# Patient Record
Sex: Male | Born: 1980 | Race: Black or African American | Hispanic: No | Marital: Single | Smoking: Current every day smoker
Health system: Southern US, Community
[De-identification: ages and names within clinical notes are randomized; demographics above are authoritative.]

## PROBLEM LIST (undated history)

## (undated) ENCOUNTER — Emergency Department
Admission: EM | Disposition: A | Discharge status: Home / Self Care | Payer: Self-pay | Attending: Diagnostic Radiology | Admitting: Diagnostic Radiology

## (undated) DIAGNOSIS — E119 Type 2 diabetes mellitus without complications: Secondary | ICD-10-CM

## (undated) DIAGNOSIS — Z8619 Personal history of other infectious and parasitic diseases: Secondary | ICD-10-CM

## (undated) DIAGNOSIS — I10 Essential (primary) hypertension: Secondary | ICD-10-CM

## (undated) HISTORY — DX: Personal history of other infectious and parasitic diseases: Z86.19

---

## 2009-05-30 ENCOUNTER — Emergency Department: Payer: Self-pay | Admitting: Emergency Medicine

## 2010-01-01 ENCOUNTER — Emergency Department: Payer: Self-pay | Admitting: Unknown Physician Specialty

## 2010-01-05 ENCOUNTER — Observation Stay: Payer: Self-pay | Admitting: Unknown Physician Specialty

## 2010-06-04 HISTORY — PX: FRACTURE SURGERY: SHX138

## 2010-09-06 ENCOUNTER — Emergency Department: Payer: Self-pay | Admitting: Emergency Medicine

## 2011-05-22 ENCOUNTER — Emergency Department: Payer: Self-pay | Admitting: Emergency Medicine

## 2011-09-06 ENCOUNTER — Emergency Department: Payer: Self-pay | Admitting: Emergency Medicine

## 2012-07-20 ENCOUNTER — Emergency Department: Payer: Self-pay | Admitting: Emergency Medicine

## 2012-10-29 ENCOUNTER — Emergency Department: Payer: Self-pay | Admitting: Internal Medicine

## 2013-05-31 ENCOUNTER — Emergency Department: Payer: Self-pay | Admitting: Emergency Medicine

## 2013-05-31 LAB — RAPID INFLUENZA A&B ANTIGENS

## 2013-06-30 ENCOUNTER — Emergency Department: Payer: Self-pay | Admitting: Emergency Medicine

## 2013-12-11 ENCOUNTER — Emergency Department: Payer: Self-pay | Admitting: Emergency Medicine

## 2013-12-11 LAB — CBC WITH DIFFERENTIAL/PLATELET
Basophil #: 0.1 10*3/uL (ref 0.0–0.1)
Basophil %: 0.8 %
Eosinophil #: 0.1 10*3/uL (ref 0.0–0.7)
Eosinophil %: 0.8 %
HCT: 45.1 % (ref 40.0–52.0)
HGB: 14.6 g/dL (ref 13.0–18.0)
Lymphocyte #: 2.5 10*3/uL (ref 1.0–3.6)
Lymphocyte %: 24 %
MCH: 25.7 pg — ABNORMAL LOW (ref 26.0–34.0)
MCHC: 32.4 g/dL (ref 32.0–36.0)
MCV: 79 fL — AB (ref 80–100)
Monocyte #: 0.7 x10 3/mm (ref 0.2–1.0)
Monocyte %: 6.5 %
NEUTROS ABS: 7 10*3/uL — AB (ref 1.4–6.5)
Neutrophil %: 67.9 %
Platelet: 184 10*3/uL (ref 150–440)
RBC: 5.68 10*6/uL (ref 4.40–5.90)
RDW: 13.6 % (ref 11.5–14.5)
WBC: 10.4 10*3/uL (ref 3.8–10.6)

## 2013-12-11 LAB — COMPREHENSIVE METABOLIC PANEL
ALT: 20 U/L (ref 12–78)
AST: 17 U/L (ref 15–37)
Albumin: 3.7 g/dL (ref 3.4–5.0)
Alkaline Phosphatase: 72 U/L
Anion Gap: 10 (ref 7–16)
BILIRUBIN TOTAL: 0.6 mg/dL (ref 0.2–1.0)
BUN: 8 mg/dL (ref 7–18)
CHLORIDE: 103 mmol/L (ref 98–107)
CO2: 26 mmol/L (ref 21–32)
Calcium, Total: 8.9 mg/dL (ref 8.5–10.1)
Creatinine: 1.44 mg/dL — ABNORMAL HIGH (ref 0.60–1.30)
EGFR (African American): >60
EGFR (Non-African Amer.): >60
GLUCOSE: 124 mg/dL — AB (ref 65–99)
Osmolality: 277 (ref 275–301)
POTASSIUM: 3.8 mmol/L (ref 3.5–5.1)
Sodium: 139 mmol/L (ref 136–145)
Total Protein: 7.6 g/dL (ref 6.4–8.2)

## 2013-12-11 LAB — TROPONIN I: Troponin-I: <0.02 ng/mL

## 2013-12-11 LAB — LIPASE, BLOOD: Lipase: 97 U/L (ref 73–393)

## 2014-07-05 ENCOUNTER — Emergency Department: Payer: Self-pay | Admitting: Emergency Medicine

## 2014-07-05 LAB — URINALYSIS, COMPLETE
BILIRUBIN, UR: NEGATIVE
BLOOD: NEGATIVE
Bacteria: NONE SEEN
Glucose,UR: NEGATIVE mg/dL (ref 0–75)
KETONE: NEGATIVE
LEUKOCYTE ESTERASE: NEGATIVE
Nitrite: NEGATIVE
Ph: 5 (ref 4.5–8.0)
Protein: 30
RBC,UR: NONE SEEN /HPF (ref 0–5)
Specific Gravity: 1.027 (ref 1.003–1.030)
Squamous Epithelial: <1

## 2014-07-05 LAB — CBC WITH DIFFERENTIAL/PLATELET
BASOS ABS: 0 10*3/uL (ref 0.0–0.1)
Basophil %: 0.4 %
EOS PCT: 0.8 %
Eosinophil #: 0.1 10*3/uL (ref 0.0–0.7)
HCT: 45.8 % (ref 40.0–52.0)
HGB: 14.7 g/dL (ref 13.0–18.0)
LYMPHS PCT: 11.1 %
Lymphocyte #: 0.9 10*3/uL — ABNORMAL LOW (ref 1.0–3.6)
MCH: 24.7 pg — ABNORMAL LOW (ref 26.0–34.0)
MCHC: 32.2 g/dL (ref 32.0–36.0)
MCV: 77 fL — ABNORMAL LOW (ref 80–100)
MONOS PCT: 7.4 %
Monocyte #: 0.6 x10 3/mm (ref 0.2–1.0)
Neutrophil #: 6.3 10*3/uL (ref 1.4–6.5)
Neutrophil %: 80.3 %
PLATELETS: 179 10*3/uL (ref 150–440)
RBC: 5.95 10*6/uL — ABNORMAL HIGH (ref 4.40–5.90)
RDW: 13.9 % (ref 11.5–14.5)
WBC: 7.9 10*3/uL (ref 3.8–10.6)

## 2014-07-05 LAB — COMPREHENSIVE METABOLIC PANEL
ALK PHOS: 82 U/L (ref 46–116)
ALT: 26 U/L (ref 14–63)
AST: 24 U/L (ref 15–37)
Albumin: 3.3 g/dL — ABNORMAL LOW (ref 3.4–5.0)
Anion Gap: 7 (ref 7–16)
BILIRUBIN TOTAL: 0.9 mg/dL (ref 0.2–1.0)
BUN: 8 mg/dL (ref 7–18)
CO2: 24 mmol/L (ref 21–32)
Calcium, Total: 8.5 mg/dL (ref 8.5–10.1)
Chloride: 106 mmol/L (ref 98–107)
Creatinine: 1.37 mg/dL — ABNORMAL HIGH (ref 0.60–1.30)
EGFR (African American): >60
Glucose: 118 mg/dL — ABNORMAL HIGH (ref 65–99)
Osmolality: 273 (ref 275–301)
Potassium: 3.7 mmol/L (ref 3.5–5.1)
Sodium: 137 mmol/L (ref 136–145)
Total Protein: 7.6 g/dL (ref 6.4–8.2)

## 2014-07-05 LAB — LIPASE, BLOOD: Lipase: 70 U/L — ABNORMAL LOW (ref 73–393)

## 2015-08-09 ENCOUNTER — Emergency Department: Payer: Self-pay

## 2015-08-09 ENCOUNTER — Emergency Department
Admission: EM | Admit: 2015-08-09 | Discharge: 2015-08-09 | Disposition: A | Discharge status: Home / Self Care | Payer: Self-pay | Attending: Emergency Medicine | Admitting: Emergency Medicine

## 2015-08-09 DIAGNOSIS — I10 Essential (primary) hypertension: Secondary | ICD-10-CM | POA: Insufficient documentation

## 2015-08-09 DIAGNOSIS — M546 Pain in thoracic spine: Secondary | ICD-10-CM | POA: Insufficient documentation

## 2015-08-09 DIAGNOSIS — J069 Acute upper respiratory infection, unspecified: Secondary | ICD-10-CM | POA: Insufficient documentation

## 2015-08-09 DIAGNOSIS — F17210 Nicotine dependence, cigarettes, uncomplicated: Secondary | ICD-10-CM | POA: Insufficient documentation

## 2015-08-09 DIAGNOSIS — Z20828 Contact with and (suspected) exposure to other viral communicable diseases: Secondary | ICD-10-CM | POA: Insufficient documentation

## 2015-08-09 HISTORY — DX: Essential (primary) hypertension: I10

## 2015-08-09 LAB — RAPID INFLUENZA A&B ANTIGENS: Influenza A (ARMC): NEGATIVE

## 2015-08-09 LAB — RAPID INFLUENZA A&B ANTIGENS (ARMC ONLY): INFLUENZA B (ARMC): NEGATIVE

## 2015-08-09 MED ORDER — PSEUDOEPH-BROMPHEN-DM 30-2-10 MG/5ML PO SYRP: 10.0000 mL | ORAL_SOLUTION | Freq: Four times a day (QID) | ORAL | Status: DC | PRN

## 2015-08-09 MED ORDER — OSELTAMIVIR PHOSPHATE 75 MG PO CAPS: 75.0000 mg | ORAL_CAPSULE | Freq: Every day | ORAL | Status: DC

## 2015-08-09 NOTE — ED Notes (Signed)
Pt c/o cough congestion that started today and states his children were recently dx with the flu..Marland Kitchen

## 2015-08-09 NOTE — Discharge Instructions (Signed)

## 2015-08-09 NOTE — ED Provider Notes (Signed)
[facility] Emergency Department Provider Note  ____________________________________________  Time seen: Approximately 10:10 AM  I have reviewed the triage vital signs and the nursing notes.   HISTORY  Chief Complaint URI    HPI Dalton Foster is a 35 y.o. male, NAD, reports the emergency department with 10 day history of cough and congestion. Notes he had body aches began yesterday. Has a child that was diagnosed with flu yesterday. Has not had any fever, chills. Denies any nasal congestion, ear pain, sore throat. Concerned he may have pneumonia. Has been taking Alka-Seltzer plus over-the-counter with no alleviation of symptoms.   Past Medical History  Diagnosis Date  . Hypertension     There are no active problems to display for this patient.   Past Surgical History  Procedure Laterality Date  . Fracture surgery      Current Outpatient Rx  Name  Route  Sig  Dispense  Refill  . brompheniramine-pseudoephedrine-DM 30-2-10 MG/5ML syrup   Oral   Take 10 mLs by mouth 4 (four) times daily as needed.   200 mL   0   . oseltamivir (TAMIFLU) 75 MG capsule   Oral   Take 1 capsule (75 mg total) by mouth daily.   10 capsule   0     Allergies Review of patient's allergies indicates no known allergies.  No family history on file.  Social History Social History  Substance Use Topics  . Smoking status: Current Every Day Smoker    Types: Cigarettes  . Smokeless tobacco: None  . Alcohol Use: Yes     Review of Systems  Constitutional: No fever/chills, fatigue Eyes: No visual changes. No discharge, erythema, swelling. ENT: No sore throat, nasal congestion, ear pain, runny nose. Cardiovascular: No chest pain. Respiratory: Positive chest congestion, cough. No shortness of breath. No wheezing.  Gastrointestinal: No abdominal pain.  No nausea, vomiting.  No diarrhea.  Musculoskeletal: Positive for upper torso aches.  Skin: Negative for  rash. Neurological: Negative for headaches, focal weakness or numbness. 10-point ROS otherwise negative.  ____________________________________________   PHYSICAL EXAM:  VITAL SIGNS: ED Triage Vitals  Enc Vitals Group     BP 08/09/15 0929 146/95 mmHg     Pulse Rate 08/09/15 0929 83     Resp 08/09/15 0929 18     Temp 08/09/15 0929 98.1 F (36.7 C)     Temp Source 08/09/15 0929 Oral     SpO2 08/09/15 0929 100 %     Weight 08/09/15 0929 265 lb (120.203 kg)     Height 08/09/15 0929 5\' 7"  (1.702 m)     Head Cir --      Peak Flow --      Pain Score 08/09/15 0930 4     Pain Loc --      Pain Edu? --      Excl. in GC? --     Constitutional: Alert and oriented. Well appearing and in no acute distress. Eyes: Conjunctivae are normal. PERRL. EOMI without pain.  Head: Atraumatic. ENT:      Ears: TMs visualized bilaterally without erythema, swelling, perforation. Mild serous effusion bilaterally.      Nose: No congestion/rhinnorhea.      Mouth/Throat: Mucous membranes are moist. Pharynx without erythema, swelling, exudate. Neck: No stridor. Supple with full range of motion Hematological/Lymphatic/Immunilogical: No cervical lymphadenopathy. Cardiovascular: Normal rate, regular rhythm. Normal S1 and S2.  Good peripheral circulation. Respiratory: Normal respiratory effort without tachypnea or retractions. Lungs CTAB. Neurologic:  Normal speech and language. No gross focal neurologic deficits are appreciated.  Skin:  Skin is warm, dry and intact. No rash noted. Psychiatric: Mood and affect are normal. Speech and behavior are normal. Patient exhibits appropriate insight and judgement.   ____________________________________________   LABS (all labs ordered are listed, but only abnormal results are displayed)  Labs Reviewed  RAPID INFLUENZA A&B ANTIGENS ([facility] ONLY)   ____________________________________________  EKG  None ____________________________________________  RADIOLOGY I  have personally viewed and evaluated these images (plain radiographs) as part of my medical decision making, as well as reviewing the written report by the radiologist.  Dg Chest 2 View  08/09/2015  CLINICAL DATA:  Cough and congestion EXAM: CHEST  2 VIEW COMPARISON:  None. FINDINGS: Normal heart size. Low lung volumes. No pneumothorax. No pleural effusion. IMPRESSION: No active cardiopulmonary disease. Electronically Signed   By: Jolaine Click M.D.   On: 08/09/2015 11:08    ____________________________________________    PROCEDURES  Procedure(s) performed: None    Medications - No data to display   ____________________________________________   INITIAL IMPRESSION / ASSESSMENT AND PLAN / ED COURSE  Pertinent labs & imaging results that were available during my care of the patient were reviewed by me and considered in my medical decision making (see chart for details).  Patient's diagnosis is consistent with viral respiratory infection and exposure to influenza. Patient will be discharged home with prescriptions for Rondec-DM to take as directed. Also gave prophylactic treatment with Tamiflu 75 mg capsules take 1 capsule by mouth once daily.. Patient is to follow up with [facility] to establish care and to follow-up if symptoms persist past this treatment course. Patient is given ED precautions to return to the ED for any worsening or new symptoms.    ____________________________________________  FINAL CLINICAL IMPRESSION(S) / ED DIAGNOSES  Final diagnoses:  Upper respiratory infection  Exposure to influenza      NEW MEDICATIONS STARTED DURING THIS VISIT:  New Prescriptions   BROMPHENIRAMINE-PSEUDOEPHEDRINE-DM 30-2-10 MG/5ML SYRUP    Take 10 mLs by mouth 4 (four) times daily as needed.   OSELTAMIVIR (TAMIFLU) 75 MG CAPSULE    Take 1 capsule (75 mg total) by mouth daily.         Hope Pigeon, PA-C 08/09/15 1128  Sharman Cheek, MD 08/09/15  [phone]

## 2015-08-09 NOTE — ED Notes (Signed)
States cough and congestion which started this am unsure of fever   But afebrile on arrival to ed

## 2015-09-05 ENCOUNTER — Emergency Department
Admission: EM | Admit: 2015-09-05 | Discharge: 2015-09-05 | Disposition: A | Discharge status: Home / Self Care | Payer: Self-pay | Attending: Emergency Medicine | Admitting: Emergency Medicine

## 2015-09-05 ENCOUNTER — Encounter: Payer: Self-pay | Admitting: Emergency Medicine

## 2015-09-05 DIAGNOSIS — F17210 Nicotine dependence, cigarettes, uncomplicated: Secondary | ICD-10-CM | POA: Insufficient documentation

## 2015-09-05 DIAGNOSIS — I10 Essential (primary) hypertension: Secondary | ICD-10-CM | POA: Insufficient documentation

## 2015-09-05 DIAGNOSIS — J0100 Acute maxillary sinusitis, unspecified: Secondary | ICD-10-CM | POA: Insufficient documentation

## 2015-09-05 MED ORDER — FEXOFENADINE-PSEUDOEPHED ER 60-120 MG PO TB12: 1.0000 | ORAL_TABLET | Freq: Two times a day (BID) | ORAL | Status: DC

## 2015-09-05 MED ORDER — AMOXICILLIN 500 MG PO CAPS: 500.0000 mg | ORAL_CAPSULE | Freq: Three times a day (TID) | ORAL | Status: DC

## 2015-09-05 NOTE — ED Provider Notes (Signed)
[facility] Emergency Department Provider Note  ____________________________________________  Time seen: Approximately 12:31 PM  I have reviewed the triage vital signs and the nursing notes.   HISTORY  Chief Complaint Sore Throat and Nasal Congestion    HPI Dalton Foster is a 35 y.o. male patient complaining of cough and nasal congestion for 4 days. Patient states having headache with sinus pressure.Patient also complaining of sore throats secondary to the postnasal drainage. Patient states cough is nonproductive. Patient rates his pain discomfort as a 4/10. Patient describes his pain discomfort as as "pressure". No palliative measures taken for this complaint.   Past Medical History  Diagnosis Date  . Hypertension     There are no active problems to display for this patient.   Past Surgical History  Procedure Laterality Date  . Fracture surgery      Current Outpatient Rx  Name  Route  Sig  Dispense  Refill  . amoxicillin (AMOXIL) 500 MG capsule   Oral   Take 1 capsule (500 mg total) by mouth 3 (three) times daily.   30 capsule   0   . brompheniramine-pseudoephedrine-DM 30-2-10 MG/5ML syrup   Oral   Take 10 mLs by mouth 4 (four) times daily as needed.   200 mL   0   . fexofenadine-pseudoephedrine (ALLEGRA-D) 60-120 MG 12 hr tablet   Oral   Take 1 tablet by mouth 2 (two) times daily.   30 tablet   0   . oseltamivir (TAMIFLU) 75 MG capsule   Oral   Take 1 capsule (75 mg total) by mouth daily.   10 capsule   0     Allergies Review of patient's allergies indicates no known allergies.  No family history on file.  Social History Social History  Substance Use Topics  . Smoking status: Current Every Day Smoker    Types: Cigarettes  . Smokeless tobacco: None  . Alcohol Use: Yes    Review of Systems Constitutional: No fever/chills Eyes: No visual changes. ENT: Sore throat. Sinus congestion and runny nose. Cardiovascular:  Denies chest pain. Respiratory: Denies shortness of breath. Nonproductive cough Gastrointestinal: No abdominal pain.  No nausea, no vomiting.  No diarrhea.  No constipation. Genitourinary: Negative for dysuria. Musculoskeletal: Negative for back pain. Skin: Negative for rash. Neurological: Positive for frontal headaches, but denies focal weakness or numbness.   ____________________________________________   PHYSICAL EXAM:  VITAL SIGNS: ED Triage Vitals  Enc Vitals Group     BP 09/05/15 1127 136/91 mmHg     Pulse Rate 09/05/15 1127 82     Resp 09/05/15 1127 14     Temp 09/05/15 1127 97.6 F (36.4 C)     Temp Source 09/05/15 1127 Oral     SpO2 09/05/15 1127 100 %     Weight 09/05/15 1127 265 lb (120.203 kg)     Height 09/05/15 1127 5\' 7"  (1.702 m)     Head Cir --      Peak Flow --      Pain Score 09/05/15 1127 4     Pain Loc --      Pain Edu? --      Excl. in GC? --     Constitutional: Alert and oriented. Well appearing and in no acute distress. Eyes: Conjunctivae are normal. PERRL. EOMI. Head: Atraumatic. Nose: Bilateral frontal maxillary sinus guarding. Edematous nasal tears with thick yellow greenish nasal discharge. Mouth/Throat: Mucous membranes are moist.  Oropharynx non-erythematous. Copious postnasal drainage Neck: No  stridor.  No cervical spine tenderness to palpation. Hematological/Lymphatic/Immunilogical: No cervical lymphadenopathy. Cardiovascular: Normal rate, regular rhythm. Grossly normal heart sounds.  Good peripheral circulation. Respiratory: Normal respiratory effort.  No retractions. Lungs CTAB. Gastrointestinal: Soft and nontender. No distention. No abdominal bruits. No CVA tenderness. Musculoskeletal: No lower extremity tenderness nor edema.  No joint effusions. Neurologic:  Normal speech and language. No gross focal neurologic deficits are appreciated. No gait instability. Skin:  Skin is warm, dry and intact. No rash noted. Psychiatric: Mood and  affect are normal. Speech and behavior are normal.  ____________________________________________   LABS (all labs ordered are listed, but only abnormal results are displayed)  Labs Reviewed - No data to display ____________________________________________  EKG   ____________________________________________  RADIOLOGY   ____________________________________________   PROCEDURES  Procedure(s) performed: None  Critical Care performed: No  ____________________________________________   INITIAL IMPRESSION / ASSESSMENT AND PLAN / ED COURSE  Pertinent labs & imaging results that were available during my care of the patient were reviewed by me and considered in my medical decision making (see chart for details).  Sinusitis. Patient given discharge Instructions. Patient given a prescription for amoxicillin in the leg daily. Patient given a work note for today. Patient advised to follow-up with "clinic if condition persists greater than 1 week. ____________________________________________   FINAL CLINICAL IMPRESSION(S) / ED DIAGNOSES  Final diagnoses:  Acute maxillary sinusitis, recurrence not specified      Joni Reining, PA-C 09/05/15 1239  Emily Filbert, MD 09/06/15 [phone]

## 2015-09-05 NOTE — Discharge Instructions (Signed)
Sinusitis, Adult  Sinusitis is redness, soreness, and puffiness (inflammation) of the air pockets in the bones of your face (sinuses). The redness, soreness, and puffiness can cause air and mucus to get trapped in your sinuses. This can allow germs to grow and cause an infection.   HOME CARE    Drink enough fluids to keep your pee (urine) clear or pale yellow.   Use a humidifier in your home.   Run a hot shower to create steam in the bathroom. Sit in the bathroom with the door closed. Breathe in the steam 3-4 times a day.   Put a warm, moist washcloth on your face 3-4 times a day, or as told by your doctor.   Use salt water sprays (saline sprays) to wet the thick fluid in your nose. This can help the sinuses drain.   Only take medicine as told by your doctor.  GET HELP RIGHT AWAY IF:    Your pain gets worse.   You have very bad headaches.   You are sick to your stomach (nauseous).   You throw up (vomit).   You are very sleepy (drowsy) all the time.   Your face is puffy (swollen).   Your vision changes.   You have a stiff neck.   You have trouble breathing.  MAKE SURE YOU:    Understand these instructions.   Will watch your condition.   Will get help right away if you are not doing well or get worse.     This information is not intended to replace advice given to you by your health care provider. Make sure you discuss any questions you have with your health care provider.     Document Released: 11/07/2007 Document Revised: 06/11/2014 Document Reviewed: 12/25/2011  Elsevier Interactive Patient Education 2016 [employer]

## 2015-09-05 NOTE — ED Notes (Signed)
Pt has head congestion and now feels a sore throat

## 2015-09-05 NOTE — ED Notes (Signed)
Cough  Cold and nasal congestion since Thursday   Also having some sinus pressure with h/a

## 2015-12-01 DIAGNOSIS — I10 Essential (primary) hypertension: Secondary | ICD-10-CM | POA: Insufficient documentation

## 2015-12-01 DIAGNOSIS — Z720 Tobacco use: Secondary | ICD-10-CM | POA: Insufficient documentation

## 2015-12-01 DIAGNOSIS — R519 Headache, unspecified: Secondary | ICD-10-CM | POA: Insufficient documentation

## 2015-12-01 DIAGNOSIS — G4700 Insomnia, unspecified: Secondary | ICD-10-CM | POA: Insufficient documentation

## 2015-12-01 DIAGNOSIS — R51 Headache: Secondary | ICD-10-CM

## 2015-12-09 ENCOUNTER — Ambulatory Visit (INDEPENDENT_AMBULATORY_CARE_PROVIDER_SITE_OTHER): Payer: Self-pay | Admitting: Family Medicine

## 2015-12-09 ENCOUNTER — Encounter: Payer: Self-pay | Admitting: Family Medicine

## 2015-12-09 VITALS — BP 152/94 | HR 100 | Temp 98.3°F | Resp 16

## 2015-12-09 DIAGNOSIS — I10 Essential (primary) hypertension: Secondary | ICD-10-CM

## 2015-12-09 DIAGNOSIS — Z720 Tobacco use: Secondary | ICD-10-CM

## 2015-12-09 MED ORDER — HYDROCHLOROTHIAZIDE 25 MG PO TABS: 25.0000 mg | ORAL_TABLET | Freq: Every day | ORAL | Status: DC

## 2015-12-09 MED ORDER — BUPROPION HCL ER (SR) 150 MG PO TB12: ORAL_TABLET | ORAL | Status: DC

## 2015-12-09 NOTE — Progress Notes (Signed)
Patient: Dalton PeppersRobert A Foster Male    DOB: Jan 20, 1981   35 y.o.   MRN: [id number] Visit Date: 12/09/2015  Today's Provider: Mila Merryonald Aronda Burford, MD   Chief Complaint  Patient presents with  . Hypertension    follow up   Subjective:    HPI  Hypertension, follow-up:  BP Readings from Last 3 Encounters:  09/05/15 136/91  08/09/15 146/95    He was last seen for hypertension 1 years ago.  BP at that visit was 144/94. Management since that visit includes no changes. He reports poor compliance with treatment. Has been out of medication for almost 1 year. He is not having side effects.  He is not exercising. He is adherent to low salt diet.   Outside blood pressures are not being checked. He is experiencing none.  Patient denies chest pain, chest pressure/discomfort, claudication, dyspnea, exertional chest pressure/discomfort, irregular heart beat, lower extremity edema, near-syncope, orthopnea, palpitations, paroxysmal nocturnal dyspnea, syncope and tachypnea.   Cardiovascular risk factors include hypertension, male gender, sedentary lifestyle and smoking/ tobacco exposure.  Use of agents associated with hypertension: none.     Weight trend: fluctuating a bit Wt Readings from Last 3 Encounters:  09/05/15 265 lb (120.203 kg)  08/09/15 265 lb (120.203 kg)    Current diet: in general, an "unhealthy" diet  ------------------------------------------------------------------------ He also states he would like to try medications to help stop smoking     No Known Allergies Current Meds  Medication Sig  . cetirizine (ZYRTEC) 10 MG tablet Take 1 tablet by mouth daily.  . [DISCONTINUED] amoxicillin (AMOXIL) 500 MG capsule Take 1 capsule (500 mg total) by mouth 3 (three) times daily.  . [DISCONTINUED] brompheniramine-pseudoephedrine-DM 30-2-10 MG/5ML syrup Take 10 mLs by mouth 4 (four) times daily as needed.  . [DISCONTINUED] fexofenadine-pseudoephedrine (ALLEGRA-D) 60-120 MG 12 hr  tablet Take 1 tablet by mouth 2 (two) times daily.  . [DISCONTINUED] oseltamivir (TAMIFLU) 75 MG capsule Take 1 capsule (75 mg total) by mouth daily.    Review of Systems  Constitutional: Positive for fatigue. Negative for fever, chills and appetite change.  Respiratory: Positive for cough. Negative for chest tightness, shortness of breath and wheezing.   Cardiovascular: Negative for chest pain and palpitations.  Gastrointestinal: Negative for nausea, vomiting and abdominal pain.  Musculoskeletal: Positive for myalgias (pain in lower right arm at night).    Social History  Substance Use Topics  . Smoking status: Current Every Day Smoker -- 0.75 packs/day    Types: Cigarettes  . Smokeless tobacco: Not on file  . Alcohol Use: 0.0 oz/week    0 Standard drinks or equivalent per week     Comment: occasional use   Objective:   BP 152/94 mmHg  Pulse 100  Temp(Src) 98.3 F (36.8 C) (Oral)  Resp 16  SpO2 98%  Physical Exam   General Appearance:    Alert, cooperative, no distress  Eyes:    PERRL, conjunctiva/corneas clear, EOM's intact       Lungs:     Clear to auscultation bilaterally, respirations unlabored  Heart:    Regular rate and rhythm  Neurologic:   Awake, alert, oriented x 3. No apparent focal neurological           defect.           Assessment & Plan:     1. Essential (primary) hypertension Currently off of medications which he is to restart - hydrochlorothiazide (HYDRODIURIL) 25 MG tablet; Take 1 tablet (25  mg total) by mouth daily. Reported on 12/09/2015  Dispense: 30 tablet; Refill: 5  2. Tobacco abuse  - buPROPion (WELLBUTRIN SR) 150 MG 12 hr tablet; 1 tablet daily for 3 days, then 1 tablet twice daily. Stop smoking 14 days after starting medication  Dispense: 60 tablet; Refill: 3   Return in about 6 months (around 06/10/2016).       Dalton Merryonald Pearl Bents, MD  [facility]

## 2016-06-13 ENCOUNTER — Ambulatory Visit: Payer: Self-pay | Admitting: Family Medicine

## 2016-06-15 ENCOUNTER — Encounter: Payer: Self-pay | Admitting: Emergency Medicine

## 2016-06-15 ENCOUNTER — Emergency Department: Payer: Self-pay

## 2016-06-15 ENCOUNTER — Emergency Department
Admission: EM | Admit: 2016-06-15 | Discharge: 2016-06-15 | Disposition: A | Discharge status: Home / Self Care | Payer: Self-pay | Attending: Emergency Medicine | Admitting: Emergency Medicine

## 2016-06-15 DIAGNOSIS — I10 Essential (primary) hypertension: Secondary | ICD-10-CM | POA: Insufficient documentation

## 2016-06-15 DIAGNOSIS — Z79899 Other long term (current) drug therapy: Secondary | ICD-10-CM | POA: Insufficient documentation

## 2016-06-15 DIAGNOSIS — R05 Cough: Secondary | ICD-10-CM | POA: Insufficient documentation

## 2016-06-15 DIAGNOSIS — F17210 Nicotine dependence, cigarettes, uncomplicated: Secondary | ICD-10-CM | POA: Insufficient documentation

## 2016-06-15 DIAGNOSIS — B9789 Other viral agents as the cause of diseases classified elsewhere: Secondary | ICD-10-CM

## 2016-06-15 DIAGNOSIS — J069 Acute upper respiratory infection, unspecified: Secondary | ICD-10-CM | POA: Insufficient documentation

## 2016-06-15 DIAGNOSIS — R112 Nausea with vomiting, unspecified: Secondary | ICD-10-CM | POA: Insufficient documentation

## 2016-06-15 LAB — INFLUENZA PANEL BY PCR (TYPE A & B)
Influenza A By PCR: NEGATIVE
Influenza B By PCR: NEGATIVE

## 2016-06-15 MED ORDER — ONDANSETRON 4 MG PO TBDP
4.0000 mg | ORAL_TABLET | Freq: Once | ORAL | Status: AC
  Administered 2016-06-15: 4 mg via ORAL
  Filled 2016-06-15: qty 1

## 2016-06-15 MED ORDER — FLUTICASONE PROPIONATE 50 MCG/ACT NA SUSP: 2.0000 | Freq: Every day | NASAL | 0 refills | Status: DC

## 2016-06-15 MED ORDER — PROMETHAZINE-DM 6.25-15 MG/5ML PO SYRP: 5.0000 mL | ORAL_SOLUTION | Freq: Four times a day (QID) | ORAL | 0 refills | Status: DC | PRN

## 2016-06-15 NOTE — ED Provider Notes (Signed)
ED ECG REPORT I, Jene EveryKINNER, [place], the attending physician, personally viewed and interpreted this ECG.  Date: 06/15/2016  Rate: 86 Rhythm: normal sinus rhythm QRS Axis: normal Intervals: normal ST/T Wave abnormalities: normal Conduction Disturbances: none Narrative Interpretation: unremarkable    Jene Everyobert Khayla Koppenhaver, MD 06/15/16 1457

## 2016-06-15 NOTE — ED Triage Notes (Signed)
C/o cough and congestion and bodyaches.

## 2016-06-15 NOTE — Discharge Instructions (Signed)
Rest.   Drink plenty of fluids like water and gatorade.   Take Tylenol as needed for fever or pain.

## 2016-06-15 NOTE — ED Provider Notes (Signed)
[facility] Emergency Department Provider Note  ____________________________________________  Time seen: Approximately 2:15 PM  I have reviewed the triage vital signs and the nursing notes.   HISTORY  Chief Complaint Cough    HPI Dalton Foster is a 36 y.o. male , NAD, presents to emergency for evaluation of multiple symptoms. Patient states he had sudden onset of cough, chest congestion, fever, fatigue, chest pain, abdominal pain, nausea and vomiting as of this morning. States he was exposed to his neighbor had the same symptoms over the last couple of days. Denies fevers but has had chills and sweats. Has had no shortness of breath or wheezing. Denies diarrhea or changes in urinary habits. Has not had a flu vaccine. Denies any changes in speech or gait. No numbness, weakness or tingling.   Past Medical History:  Diagnosis Date  . History of chicken pox   . Hypertension     Patient Active Problem List   Diagnosis Date Noted  . Essential (primary) hypertension 12/01/2015  . Headache 12/01/2015  . Tobacco abuse 12/01/2015  . Insomnia 12/01/2015    Past Surgical History:  Procedure Laterality Date  . FRACTURE SURGERY  2012   reduction of closed fracture    Prior to Admission medications   Medication Sig Start Date End Date Taking? Authorizing Provider  buPROPion (WELLBUTRIN SR) 150 MG 12 hr tablet 1 tablet daily for 3 days, then 1 tablet twice daily. Stop smoking 14 days after starting medication 12/09/15 04/07/16  Malva Limesonald E Fisher, MD  cetirizine (ZYRTEC) 10 MG tablet Take 1 tablet by mouth daily.    Historical Provider, MD  fluticasone (FLONASE) 50 MCG/ACT nasal spray Place 2 sprays into both nostrils daily. 06/15/16   Claudius Mich L Narada Uzzle, PA-C  hydrochlorothiazide (HYDRODIURIL) 25 MG tablet Take 1 tablet (25 mg total) by mouth daily. Reported on 12/09/2015 12/09/15   Malva Limesonald E Fisher, MD  promethazine-dextromethorphan (PROMETHAZINE-DM) 6.25-15 MG/5ML syrup Take  5 mLs by mouth 4 (four) times daily as needed for cough. 06/15/16   Montzerrat Brunell L Naylene Foell, PA-C    Allergies Patient has no known allergies.  Family History  Problem Relation Age of Onset  . Diabetes Mother     type 2    Social History Social History  Substance Use Topics  . Smoking status: Current Every Day Smoker    Packs/day: 0.75    Types: Cigarettes  . Smokeless tobacco: Never Used  . Alcohol use 0.0 oz/week     Comment: occasional use     Review of Systems  Constitutional: Positive fever, chills, sweats and fatigue Eyes: No visual changes. No discharge ENT: Positive nasal congestion, runny nose. No sore throat, ear pain, sinus pressure.  Cardiovascular: Positive chest pain but no palpitations. Respiratory: Positive cough, chest congestion. No shortness of breath. No wheezing.  Gastrointestinal: As of abdominal pain, nausea and vomiting.  No diarrhea.  No constipation. Genitourinary: Negative for dysuria. No hematuria. No urinary hesitancy, urgency or increased frequency. Musculoskeletal: Positive general myalgias. Negative for back pain.  Skin: Negative for rash. Neurological: Negative for headaches, focal weakness or numbness. No tingling. No LOC, lightheadedness, dizziness.  10-point ROS otherwise negative.  ____________________________________________   PHYSICAL EXAM:  VITAL SIGNS: ED Triage Vitals  Enc Vitals Group     BP 06/15/16 1342 (!) 143/98     Pulse Rate 06/15/16 1342 86     Resp 06/15/16 1342 20     Temp 06/15/16 1342 98.9 F (37.2 C)     Temp  Source 06/15/16 1342 Oral     SpO2 06/15/16 1342 100 %     Weight --      Height --      Head Circumference --      Peak Flow --      Pain Score 06/15/16 1341 5     Pain Loc --      Pain Edu? --      Excl. in GC? --      Constitutional: Alert and oriented. Well appearing and in no acute distress. Eyes: Conjunctivae are normalWithout icterus, injection or discharge. PERRLA. EOMI without pain.  Head:  Atraumatic. ENT:      Ears: TMs visualized bilaterally without erythema, effusion, bulging or perforation.      Nose: Moderate congestion with clear rhinorrhea. Bilateral turbinates are injected and mildly edematous.      Mouth/Throat: Mucous membranes are moist. Pharynx with mild injection but no swelling or exudate. Uvula is midline. Airway is patent. Neck: No stridor. Supple with full range of motion. Hematological/Lymphatic/Immunilogical: No cervical lymphadenopathy. Cardiovascular: Normal rate, regular rhythm. Normal S1 and S2. No murmurs, rubs, gallops. Good peripheral circulation. Respiratory: Normal respiratory effort without tachypnea or retractions. Lungs CTAB with breath sounds noted in all lung fields. No wheeze, rhonchi, rales. Gastrointestinal: Soft and nontender without distention or guarding in all quadrants. Bowel sounds present normoactive in all quadrants. No rebound or rigidity. No masses or hepatosplenomegaly. Abdomen is obese. Musculoskeletal: No lower extremity tenderness nor edema.  No joint effusions. Neurologic:  Normal speech and language. Normal gait and posture. No gross focal neurologic deficits are appreciated.  Skin:  Skin is warm, dry and intact. No rash noted. Psychiatric: Mood and affect are normal. Speech and behavior are normal. Patient exhibits appropriate insight and judgement.   ____________________________________________   LABS (all labs ordered are listed, but only abnormal results are displayed)  Labs Reviewed  INFLUENZA PANEL BY PCR (TYPE A & B, H1N1)   ____________________________________________  EKG  EKG reveals normal sinus rhythm with a ventricular rate of 86 bpm. EKG also reviewed by Dr. Jene Every ____________________________________________  RADIOLOGY I, Hope Pigeon, personally viewed and evaluated these images (plain radiographs) as part of my medical decision making, as well as reviewing the written report by the  radiologist.  Dg Chest 2 View  Result Date: 06/15/2016 CLINICAL DATA:  36 year old male with sudden onset cough fever and weakness. Initial encounter. Smoker. EXAM: CHEST  2 VIEW COMPARISON:  08/09/2015. FINDINGS: Large body habitus. Lung volumes are slightly lower but remain within normal limits. Normal cardiac size and mediastinal contours. Visualized tracheal air column is within normal limits. The lungs are clear. No pneumothorax or pleural effusion. No acute osseous abnormality identified. Negative visible bowel gas pattern. IMPRESSION: No acute cardiopulmonary abnormality. Electronically Signed   By: Odessa Fleming M.D.   On: 06/15/2016 14:43    ____________________________________________    PROCEDURES  Procedure(s) performed: None   Procedures   Medications  ondansetron (ZOFRAN-ODT) disintegrating tablet 4 mg (4 mg Oral Given 06/15/16 1448)     ____________________________________________   INITIAL IMPRESSION / ASSESSMENT AND PLAN / ED COURSE  Pertinent labs & imaging results that were available during my care of the patient were reviewed by me and considered in my medical decision making (see chart for details).  Clinical Course     Patient's diagnosis is consistent with Viral URI with cough and non-intractable vomiting with nausea. Patient had one episode of emesis at the time of triage and  was given Zofran ODT. Patient states his nausea has completely resolved he has had no further episodes of emesis. He was able to drink a bottle of Gatorade while in the emergency department without any difficulty. EKG and chest x-ray were without abnormality and patient tested negative for flu at this time. Patient's vital signs remained within normal limits throughout his entire ED course which is reassuring. Patient will be discharged home with prescriptions for promethazine DM and Flonase to take as directed. Patient is advised to take over-the-counter Tylenol or ibuprofen as needed for  fever or aches. Patient is to follow up with his primary care provider or [facility] if symptoms persist past this treatment course. Patient is given ED precautions to return to the ED for any worsening or new symptoms.    ____________________________________________  FINAL CLINICAL IMPRESSION(S) / ED DIAGNOSES  Final diagnoses:  Viral URI with cough  Non-intractable vomiting with nausea, unspecified vomiting type      NEW MEDICATIONS STARTED DURING THIS VISIT:  Discharge Medication List as of 06/15/2016  3:58 PM    START taking these medications   Details  fluticasone (FLONASE) 50 MCG/ACT nasal spray Place 2 sprays into both nostrils daily., Starting Fri 06/15/2016, Print    promethazine-dextromethorphan (PROMETHAZINE-DM) 6.25-15 MG/5ML syrup Take 5 mLs by mouth 4 (four) times daily as needed for cough., Starting Fri 06/15/2016, Print             Ernestene Kiel [place], PA-C 06/15/16 1749    Arnaldo Natal, MD 06/15/16 2035

## 2016-06-15 NOTE — ED Notes (Signed)
Patient transported to X-ray 

## 2016-09-08 ENCOUNTER — Emergency Department
Admission: EM | Admit: 2016-09-08 | Discharge: 2016-09-08 | Disposition: A | Discharge status: Home / Self Care | Payer: Self-pay | Attending: Emergency Medicine | Admitting: Emergency Medicine

## 2016-09-08 DIAGNOSIS — I10 Essential (primary) hypertension: Secondary | ICD-10-CM | POA: Insufficient documentation

## 2016-09-08 DIAGNOSIS — R05 Cough: Secondary | ICD-10-CM | POA: Insufficient documentation

## 2016-09-08 DIAGNOSIS — R69 Illness, unspecified: Secondary | ICD-10-CM

## 2016-09-08 DIAGNOSIS — R110 Nausea: Secondary | ICD-10-CM | POA: Insufficient documentation

## 2016-09-08 DIAGNOSIS — R5383 Other fatigue: Secondary | ICD-10-CM | POA: Insufficient documentation

## 2016-09-08 DIAGNOSIS — F17210 Nicotine dependence, cigarettes, uncomplicated: Secondary | ICD-10-CM | POA: Insufficient documentation

## 2016-09-08 DIAGNOSIS — R51 Headache: Secondary | ICD-10-CM | POA: Insufficient documentation

## 2016-09-08 DIAGNOSIS — J111 Influenza due to unidentified influenza virus with other respiratory manifestations: Secondary | ICD-10-CM

## 2016-09-08 DIAGNOSIS — R0981 Nasal congestion: Secondary | ICD-10-CM | POA: Insufficient documentation

## 2016-09-08 DIAGNOSIS — R509 Fever, unspecified: Secondary | ICD-10-CM | POA: Insufficient documentation

## 2016-09-08 DIAGNOSIS — Z79899 Other long term (current) drug therapy: Secondary | ICD-10-CM | POA: Insufficient documentation

## 2016-09-08 MED ORDER — PREDNISONE 10 MG (21) PO TBPK: ORAL_TABLET | ORAL | 0 refills | Status: DC

## 2016-09-08 MED ORDER — ONDANSETRON HCL 4 MG PO TABS: 4.0000 mg | ORAL_TABLET | Freq: Every day | ORAL | 0 refills | Status: DC | PRN

## 2016-09-08 NOTE — ED Triage Notes (Signed)
Patient c/o nausea, intermittent headache (currently denies), sinus congestion, productive cough (clear sputum), and fatigue beginning Wednesday.

## 2016-09-08 NOTE — ED Notes (Signed)
Pt. States fever, body aches and congestion for the past 4 days.  Pt. States clear productive cough.  Pt. In no distress at this time.  Pt. States taking tylenol this afternoon for HA.

## 2016-09-08 NOTE — ED Provider Notes (Signed)
[facility] Emergency Department Provider Note  ____________________________________________  Time seen: Approximately 8:42 PM  I have reviewed the triage vital signs and the nursing notes.   HISTORY  Chief Complaint Influenza    HPI Dalton Foster is a 36 y.o. male that presents to the emergency department with 4 days of occasional headaches, fatigue, nasal congestion, sore throat, productive cough with clear sputum, and nausea. Patient states that cough is not keeping him up at night. Cough and nausea are primarily in the morning. Patient states that it was warm on Wednesday and he went outside wearing shorts and thinks this started his symptoms. No alleviating measures have been tried. He is eating and drinking well. Patient smokes a pack of cigarettes every other day. He denies COPD. No diabetes. 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 Patient denies shortness of breath, chest pain, vomiting, abdominal pain, diarrhea, constipation   Past Medical History:  Diagnosis Date  . History of chicken pox   . Hypertension     Patient Active Problem List   Diagnosis Date Noted  . Essential (primary) hypertension 12/01/2015  . Headache 12/01/2015  . Tobacco abuse 12/01/2015  . Insomnia 12/01/2015    Past Surgical History:  Procedure Laterality Date  . FRACTURE SURGERY  2012   reduction of closed fracture    Prior to Admission medications   Medication Sig Start Date End Date Taking? Authorizing Provider  buPROPion (WELLBUTRIN SR) 150 MG 12 hr tablet 1 tablet daily for 3 days, then 1 tablet twice daily. Stop smoking 14 days after starting medication 12/09/15 04/07/16  Malva Limes, MD  cetirizine (ZYRTEC) 10 MG tablet Take 1 tablet by mouth daily.    Historical Provider, MD  fluticasone (FLONASE) 50 MCG/ACT nasal spray Place 2 sprays into both nostrils daily. 06/15/16   Jami L Hagler, PA-C  hydrochlorothiazide (HYDRODIURIL) 25 MG tablet Take 1 tablet  (25 mg total) by mouth daily. Reported on 12/09/2015 12/09/15   Malva Limes, MD  ondansetron (ZOFRAN) 4 MG tablet Take 1 tablet (4 mg total) by mouth daily as needed for nausea or vomiting. 09/08/16 09/08/17  Enid Derry, PA-C  predniSONE (STERAPRED UNI-PAK 21 TAB) 10 MG (21) TBPK tablet Take 6 tablets on day 1, take 5 tablets on day 2, take 4 tablets on day 3, take 3 tablets on day 4, take 2 tablets on day 5, take 1 tablet on day 6 09/08/16   Enid Derry, PA-C  promethazine-dextromethorphan (PROMETHAZINE-DM) 6.25-15 MG/5ML syrup Take 5 mLs by mouth 4 (four) times daily as needed for cough. 06/15/16   Jami L Hagler, PA-C    Allergies Patient has no known allergies.  Family History  Problem Relation Age of Onset  . Diabetes Mother     type 2    Social History Social History  Substance Use Topics  . Smoking status: Current Every Day Smoker    Packs/day: 0.75    Types: Cigarettes  . Smokeless tobacco: Never Used  . Alcohol use 0.0 oz/week     Comment: occasional use     Review of Systems  Constitutional: No fever/chills Eyes: No visual changes. No discharge. ENT: Positive for congestion and rhinorrhea. Cardiovascular: No chest pain. Respiratory: Positive for cough. No SOB. Gastrointestinal: No abdominal pain.  No vomiting.  No diarrhea.  No constipation. Musculoskeletal: Negative for musculoskeletal pain. Skin: Negative for rash, abrasions, lacerations, ecchymosis. Neurological: Positive for headaches.   ____________________________________________   PHYSICAL EXAM:  VITAL  SIGNS: ED Triage Vitals [09/08/16 1952]  Enc Vitals Group     BP (!) 142/92     Pulse Rate (!) 113     Resp 18     Temp 98.5 F (36.9 C)     Temp Source Oral     SpO2 99 %     Weight 270 lb (122.5 kg)     Height  (1.702 m)     Head Circumference      Peak Flow      Pain Score      Pain Loc      Pain Edu?      Excl. in GC?      Constitutional: Alert and oriented. Well appearing and in  no acute distress. Eyes: Conjunctivae are normal. PERRL. EOMI. No discharge. Head: Atraumatic. ENT: No frontal and maxillary sinus tenderness.      Ears: Tympanic membranes pearly gray with good landmarks. No discharge.      Nose: Mild congestion/rhinnorhea.      Mouth/Throat: Mucous membranes are moist. Oropharynx non-erythematous. Tonsils not enlarged. No exudates. Uvula midline. Neck: No stridor.   Hematological/Lymphatic/Immunilogical: No cervical lymphadenopathy. Cardiovascular: Normal rate, regular rhythm.  Good peripheral circulation. Respiratory: Normal respiratory effort without tachypnea or retractions. Lungs CTAB. Good air entry to the bases with no decreased or absent breath sounds. Gastrointestinal: Bowel sounds 4 quadrants. Soft and nontender to palpation. No guarding or rigidity. No palpable masses. No distention. Musculoskeletal: Full range of motion to all extremities. No gross deformities appreciated. Neurologic:  Normal speech and language. No gross focal neurologic deficits are appreciated.  Skin:  Skin is warm, dry and intact. No rash noted.   ____________________________________________   LABS (all labs ordered are listed, but only abnormal results are displayed)  Labs Reviewed - No data to display ____________________________________________  EKG   ____________________________________________  RADIOLOGY  No results found.  ____________________________________________    PROCEDURES  Procedure(s) performed:    Procedures    Medications - No data to display   ____________________________________________   INITIAL IMPRESSION / ASSESSMENT AND PLAN / ED COURSE  Pertinent labs & imaging results that were available during my care of the patient were reviewed by me and considered in my medical decision making (see chart for details).  Review of the [place] CSRS was performed in accordance of the [facility] prior to dispensing any controlled drugs.      Patient's diagnosis is consistent with influenza-like illness. Vital signs and exam are reassuring. HR reassessed at 90 bpm. Patient appears well and is staying well hydrated. Patient feels comfortable going home. Patient will be discharged home with prescriptions for prednisone and Zofran. Patient is to follow up with PCP as needed or otherwise directed. Patient is given ED precautions to return to the ED for any worsening or new symptoms.     ____________________________________________  FINAL CLINICAL IMPRESSION(S) / ED DIAGNOSES  Final diagnoses:  Influenza-like illness      NEW MEDICATIONS STARTED DURING THIS VISIT:  Discharge Medication List as of 09/08/2016  8:49 PM    START taking these medications   Details  ondansetron (ZOFRAN) 4 MG tablet Take 1 tablet (4 mg total) by mouth daily as needed for nausea or vomiting., Starting Sat 09/08/2016, Until Sun 09/08/2017, Print    predniSONE (STERAPRED UNI-PAK 21 TAB) 10 MG (21) TBPK tablet Take 6 tablets on day 1, take 5 tablets on day 2, take 4 tablets on day 3, take 3 tablets on day 4, take 2  tablets on day 5, take 1 tablet on day 6, Print            This chart was dictated using voice recognition software/Dragon. Despite best efforts to proofread, errors can occur which can change the meaning. Any change was purely unintentional.    Enid Derry, PA-C 09/08/16 2138    Sharyn Creamer, MD 09/09/16 2350

## 2016-11-06 ENCOUNTER — Emergency Department
Admission: EM | Admit: 2016-11-06 | Discharge: 2016-11-06 | Disposition: A | Discharge status: Home / Self Care | Payer: Self-pay | Attending: Emergency Medicine | Admitting: Emergency Medicine

## 2016-11-06 DIAGNOSIS — Z79899 Other long term (current) drug therapy: Secondary | ICD-10-CM | POA: Insufficient documentation

## 2016-11-06 DIAGNOSIS — J0100 Acute maxillary sinusitis, unspecified: Secondary | ICD-10-CM | POA: Insufficient documentation

## 2016-11-06 DIAGNOSIS — F17210 Nicotine dependence, cigarettes, uncomplicated: Secondary | ICD-10-CM | POA: Insufficient documentation

## 2016-11-06 DIAGNOSIS — I10 Essential (primary) hypertension: Secondary | ICD-10-CM | POA: Insufficient documentation

## 2016-11-06 MED ORDER — AMOXICILLIN-POT CLAVULANATE 875-125 MG PO TABS: 1.0000 | ORAL_TABLET | Freq: Two times a day (BID) | ORAL | 0 refills | Status: DC

## 2016-11-06 NOTE — ED Notes (Signed)
See triage note...pt c/o "feeling under the weather". Reports nasal congestion, h/a. Pt denies use of OTC medications. Pt A/OX4, MAE, resp even and unlabored. Pt asleep in waiting room when this RN went to retrieve pt.

## 2016-11-06 NOTE — ED Provider Notes (Signed)
[facility] Emergency Department Provider Note  ____________________________________________  Time seen: Approximately 5:35 PM  I have reviewed the triage vital signs and the nursing notes.   HISTORY  Chief Complaint Facial Pain and Nasal Congestion    HPI Dalton Foster is a 36 y.o. male presenting to the emergency department with rhinorrhea, maxillary sinus tenderness and myalgias for the past 2 days. Patient states that he has a history of seasonal allergies. He denies associated chest pain, chest tightness, nausea, vomiting or abdominal pain. He denies fever and chills. No alleviating measures have been attempted.   Past Medical History:  Diagnosis Date  . History of chicken pox   . Hypertension     Patient Active Problem List   Diagnosis Date Noted  . Essential (primary) hypertension 12/01/2015  . Headache 12/01/2015  . Tobacco abuse 12/01/2015  . Insomnia 12/01/2015    Past Surgical History:  Procedure Laterality Date  . FRACTURE SURGERY  2012   reduction of closed fracture    Prior to Admission medications   Medication Sig Start Date End Date Taking? Authorizing Provider  amoxicillin-clavulanate (AUGMENTIN) 875-125 MG tablet Take 1 tablet by mouth 2 (two) times daily. 11/06/16 11/16/16  Orvil Feil, PA-C  buPROPion (WELLBUTRIN SR) 150 MG 12 hr tablet 1 tablet daily for 3 days, then 1 tablet twice daily. Stop smoking 14 days after starting medication 12/09/15 04/07/16  Malva Limes, MD  cetirizine (ZYRTEC) 10 MG tablet Take 1 tablet by mouth daily.    [provider]  fluticasone (FLONASE) 50 MCG/ACT nasal spray Place 2 sprays into both nostrils daily. 06/15/16   Hagler, Jami L, PA-C  hydrochlorothiazide (HYDRODIURIL) 25 MG tablet Take 1 tablet (25 mg total) by mouth daily. Reported on 12/09/2015 12/09/15   Malva Limes, MD  ondansetron (ZOFRAN) 4 MG tablet Take 1 tablet (4 mg total) by mouth daily as needed for nausea or vomiting.  09/08/16 09/08/17  Enid Derry, PA-C  predniSONE (STERAPRED UNI-PAK 21 TAB) 10 MG (21) TBPK tablet Take 6 tablets on day 1, take 5 tablets on day 2, take 4 tablets on day 3, take 3 tablets on day 4, take 2 tablets on day 5, take 1 tablet on day 6 09/08/16   Enid Derry, PA-C  promethazine-dextromethorphan (PROMETHAZINE-DM) 6.25-15 MG/5ML syrup Take 5 mLs by mouth 4 (four) times daily as needed for cough. 06/15/16   Hagler, Jami L, PA-C    Allergies Patient has no known allergies.  Family History  Problem Relation Age of Onset  . Diabetes Mother        type 2    Social History Social History  Substance Use Topics  . Smoking status: Current Every Day Smoker    Packs/day: 0.75    Types: Cigarettes  . Smokeless tobacco: Never Used  . Alcohol use 0.0 oz/week     Comment: occasional use     Review of Systems  Constitutional: No fever/chills Eyes: No visual changes. No discharge ENT: patient has maxillary sinus tenderness Cardiovascular: no chest pain. Respiratory: no cough. No SOB. Gastrointestinal: No abdominal pain.  No nausea, no vomiting.  No diarrhea.  No constipation. Musculoskeletal: patient has myalgias Skin: Negative for rash, abrasions, lacerations, ecchymosis. Neurological: Negative for headaches, focal weakness or numbness.   ____________________________________________   PHYSICAL EXAM:  VITAL SIGNS: ED Triage Vitals  Enc Vitals Group     BP 11/06/16 1716 (!) 138/93     Pulse Rate 11/06/16 1716 91  Resp 11/06/16 1716 20     Temp 11/06/16 1716 97.9 F (36.6 C)     Temp Source 11/06/16 1716 Oral     SpO2 11/06/16 1716 94 %     Weight 11/06/16 1717 260 lb (117.9 kg)     Height 11/06/16 1717 5\' 7"  (1.702 m)     Head Circumference --      Peak Flow --      Pain Score 11/06/16 1717 8     Pain Loc --      Pain Edu? --      Excl. in GC? --      Constitutional: Alert and oriented. Well appearing and in no acute distress. Eyes: Conjunctivae are normal.  PERRL. EOMI. Head: Atraumatic. Patient has maxillary sinus tenderness ENT:      Ears: tympanic membranes are pearly bilaterally.      Nose: No congestion/rhinnorhea.      Mouth/Throat: Mucous membranes are moist.  Neck: full range of motion Hematological/Lymphatic/Immunilogical: No cervical lymphadenopathy. Cardiovascular: Normal rate, regular rhythm. Normal S1 and S2.  Good peripheral circulation. Respiratory: Normal respiratory effort without tachypnea or retractions. Lungs CTAB. Good air entry to the bases with no decreased or absent breath sounds. Skin:  Skin is warm, dry and intact. No rash noted. Psychiatric: Mood and affect are normal. Speech and behavior are normal. Patient exhibits appropriate insight and judgement.   ____________________________________________   LABS (all labs ordered are listed, but only abnormal results are displayed)  Labs Reviewed - No data to display ____________________________________________  EKG   ____________________________________________  RADIOLOGY  No results found.  ____________________________________________    PROCEDURES  Procedure(s) performed:    Procedures    Medications - No data to display   ____________________________________________   INITIAL IMPRESSION / ASSESSMENT AND PLAN / ED COURSE  Pertinent labs & imaging results that were available during my care of the patient were reviewed by me and considered in my medical decision making (see chart for details).  Review of the [place] CSRS was performed in accordance of the [facility] prior to dispensing any controlled drugs.     Assessment and plan: Acute sinusitis Patient presents to the emergency department with maxillary sinus tenderness, myalgias, congestion and rhinorrhea for the past 2 days. Patient has a history of seasonal allergies. History and physical exam findings are consistent with acute sinusitis. Patient was discharged with Augmentin. Patient was  advised to follow-up with his primary care provider in one week. Vital signs are reassuring prior to discharge. All patient questions were answered.   ____________________________________________  FINAL CLINICAL IMPRESSION(S) / ED DIAGNOSES  Final diagnoses:  Acute non-recurrent maxillary sinusitis      NEW MEDICATIONS STARTED DURING THIS VISIT:  New Prescriptions   AMOXICILLIN-CLAVULANATE (AUGMENTIN) 875-125 MG TABLET    Take 1 tablet by mouth 2 (two) times daily.        This chart was dictated using voice recognition software/Dragon. Despite best efforts to proofread, errors can occur which can change the meaning. Any change was purely unintentional.    Orvil FeilWoods, Huong Luthi M, PA-C 11/06/16 1743    Myrna BlazerSchaevitz, David Matthew, MD 11/06/16 [phone]

## 2016-11-06 NOTE — ED Triage Notes (Signed)
Pt reports to ED w/ c/o nasal congestion x 2 days. Afebrile. Ambulatory w/o issue.

## 2016-11-06 NOTE — ED Notes (Signed)
FN: pt presents with sinus pressure and congestion started today. Also reports a cough. NAD at time of arrival.

## 2016-11-09 ENCOUNTER — Other Ambulatory Visit: Payer: Self-pay | Admitting: Family Medicine

## 2016-11-09 ENCOUNTER — Encounter: Payer: Self-pay | Admitting: Family Medicine

## 2016-11-09 ENCOUNTER — Ambulatory Visit (INDEPENDENT_AMBULATORY_CARE_PROVIDER_SITE_OTHER): Payer: Self-pay | Admitting: Family Medicine

## 2016-11-09 VITALS — BP 136/80 | HR 94 | Temp 97.9°F | Resp 16 | Wt 267.0 lb

## 2016-11-09 DIAGNOSIS — I10 Essential (primary) hypertension: Secondary | ICD-10-CM

## 2016-11-09 DIAGNOSIS — J069 Acute upper respiratory infection, unspecified: Secondary | ICD-10-CM

## 2016-11-09 MED ORDER — HYDROCHLOROTHIAZIDE 25 MG PO TABS: 25.0000 mg | ORAL_TABLET | Freq: Every day | ORAL | 5 refills | Status: DC

## 2016-11-09 NOTE — Progress Notes (Signed)
Subjective:     Patient ID: Dalton Foster, male   DOB: 1980-07-13, 36 y.o.   MRN: [id number]  HPI  Chief Complaint  Patient presents with  . Sinus Problem    for a "couple weeks" he went to the ER for this and was given augmentin for a sinus infection. He says this was too exspensive and wants another antibiotic sent in. A family memeber told him that a generic Zpak may be cheaper.   . Hypertension    He reports that he has not been in for a while and wanted to follow up on his bp today. He has been donating plasma and would get "red flagged" on his BP. He has been out of his HCTZ for a "while" now.   States he was feeling better for 1.5 weeks when he developed cold sx on or about 11/04/16. Has not been taking any otc medication.   Review of Systems     Objective:   Physical Exam  Constitutional: He appears well-developed and well-nourished. No distress.  Ears: T.M's intact without inflammation Sinuses: mild frontal sinus tenderness Throat: no tonsillar enlargement or exudate Neck: no cervical adenopathy Lungs: clear     Assessment:    1. Essential (primary) hypertension - hydrochlorothiazide (HYDRODIURIL) 25 MG tablet; Take 1 tablet (25 mg total) by mouth daily. Reported on 12/09/2015  Dispense: 30 tablet; Refill: 5  2. Viral upper respiratory tract infection    Plan:    Discussed sx treatment with Mucinex D and Delsym. To call in the next few days if sinuses not improving. Asked him to minimize smoking.

## 2016-11-09 NOTE — Patient Instructions (Signed)
For your sinus congestion get Mucinex D (sign for this behind the counter) and Delsym for cough. If your sinuses are not clearing up by Monday, 6/11-call me. If you are starting to feel better expect the cough to last another week.

## 2016-11-09 NOTE — Addendum Note (Signed)
Addended by: Anola GurneyHAUVIN, Bryden on: 11/09/2016 03:14 PM   Modules accepted: Orders

## 2017-05-26 ENCOUNTER — Emergency Department: Payer: Self-pay

## 2017-05-26 ENCOUNTER — Emergency Department
Admission: EM | Admit: 2017-05-26 | Discharge: 2017-05-26 | Disposition: A | Discharge status: Home / Self Care | Payer: Self-pay | Attending: Emergency Medicine | Admitting: Emergency Medicine

## 2017-05-26 ENCOUNTER — Encounter: Payer: Self-pay | Admitting: Emergency Medicine

## 2017-05-26 ENCOUNTER — Other Ambulatory Visit: Payer: Self-pay

## 2017-05-26 DIAGNOSIS — I10 Essential (primary) hypertension: Secondary | ICD-10-CM | POA: Insufficient documentation

## 2017-05-26 DIAGNOSIS — B349 Viral infection, unspecified: Secondary | ICD-10-CM | POA: Insufficient documentation

## 2017-05-26 DIAGNOSIS — F17210 Nicotine dependence, cigarettes, uncomplicated: Secondary | ICD-10-CM | POA: Insufficient documentation

## 2017-05-26 DIAGNOSIS — Z79899 Other long term (current) drug therapy: Secondary | ICD-10-CM | POA: Insufficient documentation

## 2017-05-26 DIAGNOSIS — J069 Acute upper respiratory infection, unspecified: Secondary | ICD-10-CM | POA: Insufficient documentation

## 2017-05-26 LAB — INFLUENZA PANEL BY PCR (TYPE A & B)
Influenza A By PCR: NEGATIVE
Influenza B By PCR: NEGATIVE

## 2017-05-26 MED ORDER — PSEUDOEPH-BROMPHEN-DM 30-2-10 MG/5ML PO SYRP: 10.0000 mL | ORAL_SOLUTION | Freq: Four times a day (QID) | ORAL | 0 refills | Status: DC | PRN

## 2017-05-26 MED ORDER — FLUTICASONE PROPIONATE 50 MCG/ACT NA SUSP: 1.0000 | Freq: Two times a day (BID) | NASAL | 0 refills | Status: DC

## 2017-05-26 NOTE — ED Notes (Signed)
Reviewed discharge instructions, follow-up care, and prescriptions with patient. Patient verbalized understanding of all information reviewed. Patient stable, with no distress noted at this time.    

## 2017-05-26 NOTE — ED Triage Notes (Signed)
Presents with a 3-4 day hx of nasal congestion  Body aches and cough

## 2017-05-26 NOTE — ED Provider Notes (Signed)
[facility] Emergency Department Provider Note  ____________________________________________  Time seen: Approximately 6:22 PM  I have reviewed the triage vital signs and the nursing notes.   HISTORY  Chief Complaint Generalized Body Aches    HPI Dalton Foster is a 36 y.o. male who presents emergency department for nasal congestion, sore throat, cough.  Patient reports that he has a chronic, dry cough from his smoking history.  Patient has been smoking for the past 22 years.  Patient is attempting to stop which has been improving his cough somewhat.  Patient reports over the past 4-5 days he has had nasal congestion, sore throat, coughing.  Patient does endorse some body aches but denies any fevers or chills, difficulty breathing or swallowing.  No headache, visual changes, chest pain, shortness of breath, abdominal pain, nausea vomiting.  No over-the-counter medications for this complaint prior to arrival.  No other complaints at this time.  Past Medical History:  Diagnosis Date  . History of chicken pox   . Hypertension     Patient Active Problem List   Diagnosis Date Noted  . Essential (primary) hypertension 12/01/2015  . Headache 12/01/2015  . Tobacco abuse 12/01/2015  . Insomnia 12/01/2015    Past Surgical History:  Procedure Laterality Date  . FRACTURE SURGERY  2012   reduction of closed fracture    Prior to Admission medications   Medication Sig Start Date End Date Taking? Authorizing Provider  brompheniramine-pseudoephedrine-DM 30-2-10 MG/5ML syrup Take 10 mLs by mouth 4 (four) times daily as needed. 05/26/17   Cuthriell, Delorise Royals, PA-C  cetirizine (ZYRTEC) 10 MG tablet Take 1 tablet by mouth daily.    [provider]  fluticasone (FLONASE) 50 MCG/ACT nasal spray Place 1 spray into both nostrils 2 (two) times daily. 05/26/17   Cuthriell, Delorise Royals, PA-C  hydrochlorothiazide (HYDRODIURIL) 25 MG tablet Take 1 tablet (25 mg total)  by mouth daily. Reported on 12/09/2015 11/09/16   Anola Gurney, PA    Allergies Patient has no known allergies.  Family History  Problem Relation Age of Onset  . Diabetes Mother        type 2    Social History Social History   Tobacco Use  . Smoking status: Current Every Day Smoker    Packs/day: 0.50    Types: Cigarettes  . Smokeless tobacco: Never Used  Substance Use Topics  . Alcohol use: Yes    Alcohol/week: 0.0 oz    Comment: occasional use  . Drug use: No     Review of Systems  Constitutional: No fever/chills Eyes: No visual changes. No discharge ENT: Positive for nasal congestion sore throat Cardiovascular: no chest pain. Respiratory: Positive cough. No SOB. Gastrointestinal: No abdominal pain.  No nausea, no vomiting.  No diarrhea.  No constipation. Musculoskeletal: Negative for musculoskeletal pain. Skin: Negative for rash, abrasions, lacerations, ecchymosis. Neurological: Negative for headaches, focal weakness or numbness. 10-point ROS otherwise negative.  ____________________________________________   PHYSICAL EXAM:  VITAL SIGNS: ED Triage Vitals  Enc Vitals Group     BP 05/26/17 1807 (!) 151/95     Pulse Rate 05/26/17 1807 76     Resp 05/26/17 1807 20     Temp 05/26/17 1807 98.5 F (36.9 C)     Temp Source 05/26/17 1807 Oral     SpO2 05/26/17 1807 100 %     Weight 05/26/17 1802 267 lb (121.1 kg)     Height 05/26/17 1802 5\' 7"  (1.702 m)  Head Circumference --      Peak Flow --      Pain Score 05/26/17 1804 4     Pain Loc --      Pain Edu? --      Excl. in GC? --      Constitutional: Alert and oriented. Well appearing and in no acute distress. Eyes: Conjunctivae are normal. PERRL. EOMI. Head: Atraumatic. ENT:      Ears: EACs with cerumen but TMs are well-visualized with no acute abnormality.      Nose: Moderate clear congestion/rhinnorhea.      Mouth/Throat: Mucous membranes are moist.  Oropharynx is mildly erythematous but  nonedematous.  Uvula is midline.   Neck: No stridor.  Neck is supple full range of motion Hematological/Lymphatic/Immunilogical: No cervical lymphadenopathy. Cardiovascular: Normal rate, regular rhythm. Normal S1 and S2.  Good peripheral circulation. Respiratory: Normal respiratory effort without tachypnea or retractions. Lungs with a few scattered faint expiratory wheezes. Good air entry to the bases with no decreased or absent breath sounds. Musculoskeletal: Full range of motion to all extremities. No gross deformities appreciated. Neurologic:  Normal speech and language. No gross focal neurologic deficits are appreciated.  Skin:  Skin is warm, dry and intact. No rash noted. Psychiatric: Mood and affect are normal. Speech and behavior are normal. Patient exhibits appropriate insight and judgement.   ____________________________________________   LABS (all labs ordered are listed, but only abnormal results are displayed)  Labs Reviewed  INFLUENZA PANEL BY PCR (TYPE A & B)   ____________________________________________  EKG   ____________________________________________  RADIOLOGY Festus BarrenI, Jonathan D Cuthriell, personally viewed and evaluated these images (plain radiographs) as part of my medical decision making, as well as reviewing the written report by the radiologist.  Dg Chest 2 View  Result Date: 05/26/2017 CLINICAL DATA:  Three-4 day history nasal congestion with body aches and cough. EXAM: CHEST  2 VIEW COMPARISON:  06/15/2016 FINDINGS: Lungs are adequately inflated without focal airspace consolidation or effusion. Cardiomediastinal silhouette and remainder of the exam is unchanged. IMPRESSION: No active cardiopulmonary disease. Electronically Signed   By: Elberta Fortisaniel  Boyle M.D.   On: 05/26/2017 18:49    ____________________________________________    PROCEDURES  Procedure(s) performed:    Procedures    Medications - No data to  display   ____________________________________________   INITIAL IMPRESSION / ASSESSMENT AND PLAN / ED COURSE  Pertinent labs & imaging results that were available during my care of the patient were reviewed by me and considered in my medical decision making (see chart for details).  Review of the [place] CSRS was performed in accordance of the [facility] prior to dispensing any controlled drugs.     Patient's diagnosis is consistent with viral uri.  Initial differential included viral URI, nasal pharyngitis, sinusitis, strep, bronchitis, pneumonia, influenza..  I did not wait for influenza testing as patient does not appear clinically ill enough for influenza.  Patient will be prescribed Flonase, Bromfed cough syrup for symptoms.  Patient is to continue Tylenol Motrin at home.  Patient will follow primary care as needed.  Patient is given ED precautions to return to the ED for any worsening or new symptoms.     ____________________________________________  FINAL CLINICAL IMPRESSION(S) / ED DIAGNOSES  Final diagnoses:  Viral upper respiratory tract infection      NEW MEDICATIONS STARTED DURING THIS VISIT:  ED Discharge Orders        Ordered    fluticasone (FLONASE) 50 MCG/ACT nasal spray  2 times daily  05/26/17 1906    brompheniramine-pseudoephedrine-DM 30-2-10 MG/5ML syrup  4 times daily PRN     05/26/17 1906          This chart was dictated using voice recognition software/Dragon. Despite best efforts to proofread, errors can occur which can change the meaning. Any change was purely unintentional.    Racheal PatchesCuthriell, Jonathan D, PA-C 05/26/17 Michela [id number]    Quale, Mark, MD 05/27/17 [phone]

## 2017-07-12 DIAGNOSIS — R042 Hemoptysis: Secondary | ICD-10-CM | POA: Insufficient documentation

## 2017-07-12 DIAGNOSIS — F17210 Nicotine dependence, cigarettes, uncomplicated: Secondary | ICD-10-CM | POA: Insufficient documentation

## 2017-07-12 DIAGNOSIS — J069 Acute upper respiratory infection, unspecified: Secondary | ICD-10-CM | POA: Insufficient documentation

## 2017-07-12 DIAGNOSIS — Z79899 Other long term (current) drug therapy: Secondary | ICD-10-CM | POA: Insufficient documentation

## 2017-07-12 DIAGNOSIS — I10 Essential (primary) hypertension: Secondary | ICD-10-CM | POA: Insufficient documentation

## 2017-07-12 NOTE — ED Triage Notes (Signed)
Pt reports about 45 minutes PTA he woke up and vomited bright red blood, reports has been coughing a lot.Pt denies any nausea at present, reports throat feels "itchy" Pt accompanied by mother

## 2017-07-13 ENCOUNTER — Emergency Department: Payer: Self-pay

## 2017-07-13 ENCOUNTER — Other Ambulatory Visit: Payer: Self-pay

## 2017-07-13 ENCOUNTER — Encounter: Payer: Self-pay | Admitting: Emergency Medicine

## 2017-07-13 ENCOUNTER — Emergency Department
Admission: EM | Admit: 2017-07-13 | Discharge: 2017-07-13 | Disposition: A | Discharge status: Home / Self Care | Payer: Self-pay | Attending: Emergency Medicine | Admitting: Emergency Medicine

## 2017-07-13 DIAGNOSIS — R042 Hemoptysis: Secondary | ICD-10-CM

## 2017-07-13 DIAGNOSIS — J069 Acute upper respiratory infection, unspecified: Secondary | ICD-10-CM

## 2017-07-13 DIAGNOSIS — B9789 Other viral agents as the cause of diseases classified elsewhere: Secondary | ICD-10-CM

## 2017-07-13 LAB — COMPREHENSIVE METABOLIC PANEL
ALK PHOS: 83 U/L (ref 38–126)
ALT: 17 U/L (ref 17–63)
ANION GAP: 11 (ref 5–15)
AST: 18 U/L (ref 15–41)
Albumin: 3.6 g/dL (ref 3.5–5.0)
BUN: 12 mg/dL (ref 6–20)
CALCIUM: 8.8 mg/dL — AB (ref 8.9–10.3)
CO2: 25 mmol/L (ref 22–32)
CREATININE: 1.36 mg/dL — AB (ref 0.61–1.24)
Chloride: 104 mmol/L (ref 101–111)
Glucose, Bld: 158 mg/dL — ABNORMAL HIGH (ref 65–99)
Potassium: 4.1 mmol/L (ref 3.5–5.1)
Sodium: 140 mmol/L (ref 135–145)
TOTAL PROTEIN: 7.3 g/dL (ref 6.5–8.1)
Total Bilirubin: 0.7 mg/dL (ref 0.3–1.2)

## 2017-07-13 LAB — PROTIME-INR
INR: 0.97
PROTHROMBIN TIME: 12.8 s (ref 11.4–15.2)

## 2017-07-13 LAB — CBC WITH DIFFERENTIAL/PLATELET
Basophils Absolute: 0.1 10*3/uL (ref 0–0.1)
Basophils Relative: 1 %
EOS PCT: 1 %
Eosinophils Absolute: 0.1 10*3/uL (ref 0–0.7)
HCT: 41.1 % (ref 40.0–52.0)
HEMOGLOBIN: 14 g/dL (ref 13.0–18.0)
LYMPHS ABS: 2.5 10*3/uL (ref 1.0–3.6)
LYMPHS PCT: 25 %
MCH: 26.8 pg (ref 26.0–34.0)
MCHC: 33.9 g/dL (ref 32.0–36.0)
MCV: 79.1 fL — AB (ref 80.0–100.0)
MONOS PCT: 8 %
Monocytes Absolute: 0.8 10*3/uL (ref 0.2–1.0)
Neutro Abs: 6.7 10*3/uL — ABNORMAL HIGH (ref 1.4–6.5)
Neutrophils Relative %: 65 %
Platelets: 205 10*3/uL (ref 150–440)
RBC: 5.2 MIL/uL (ref 4.40–5.90)
RDW: 13.8 % (ref 11.5–14.5)
WBC: 10.1 10*3/uL (ref 3.8–10.6)

## 2017-07-13 LAB — INFLUENZA PANEL BY PCR (TYPE A & B)
Influenza A By PCR: NEGATIVE
Influenza B By PCR: NEGATIVE

## 2017-07-13 LAB — APTT: APTT: 28 s (ref 24–36)

## 2017-07-13 NOTE — ED Notes (Addendum)
Pt states that he was sleeping when his mother came in to check on him because he was breathing heavier than normal. He states he fell back asleep, but later he woke up because he felt like something was choking him so he woke up and went to bathroom and he vomited blood. The mother states that when he is sleeping his stomach and arms are "jumping" and he is breathing so hard that you can feel it "like a fan". Pt is alert and oriented and sitting calmly in bed. Mother wants us to also rule out  if he has diabetes.

## 2017-07-13 NOTE — ED Provider Notes (Signed)
[facility] Emergency Department Provider Note  ____________________________________________   First MD Initiated Contact with Patient 07/13/17 [phone]     (approximate)  I have reviewed the triage vital signs and the nursing notes.   HISTORY  Chief Complaint Hemoptysis    HPI Dalton Foster is a 37 y.o. male who presents for evaluation of coughing with some blood in it and some shortness of breath.  The symptoms started acutely over the last 12 hours.  They were most notable after he went to sleep and his mother noticed that he seemed to be having trouble breathing although his mother and sister both clarified that he always has trouble breathing and seems to be choking at times while he is asleep.  Tonight he seemed to be having more trouble breathing and he does report that today he was feeling some congestion, cough for a couple of days, and generally feeling rundown.  After he woke up he was coughing and choking and spit up some blood that he thinks was coming from his cough.  He denies nausea, vomiting, fever/chills, sore throat (he does report having an itching throat), chest pain, abdominal pain, and dysuria.  He is an every day smoker although he has cut back recently.  He used to binge drink alcohol on the weekends but he has not done that for about 6-9 months.  He has been otherwise healthy recently.  The symptoms were apparently acute in onset and severe tonight resolved on their own.  Past Medical History:  Diagnosis Date  . History of chicken pox   . Hypertension     Patient Active Problem List   Diagnosis Date Noted  . Essential (primary) hypertension 12/01/2015  . Headache 12/01/2015  . Tobacco abuse 12/01/2015  . Insomnia 12/01/2015    Past Surgical History:  Procedure Laterality Date  . FRACTURE SURGERY  2012   reduction of closed fracture    Prior to Admission medications   Medication Sig Start Date End Date Taking? Authorizing  Provider  brompheniramine-pseudoephedrine-DM 30-2-10 MG/5ML syrup Take 10 mLs by mouth 4 (four) times daily as needed. 05/26/17   Cuthriell, Delorise Royals, PA-C  cetirizine (ZYRTEC) 10 MG tablet Take 1 tablet by mouth daily.    [provider]  fluticasone (FLONASE) 50 MCG/ACT nasal spray Place 1 spray into both nostrils 2 (two) times daily. 05/26/17   Cuthriell, Delorise Royals, PA-C  hydrochlorothiazide (HYDRODIURIL) 25 MG tablet Take 1 tablet (25 mg total) by mouth daily. Reported on 12/09/2015 11/09/16   Anola Gurney, PA    Allergies Patient has no known allergies.  Family History  Problem Relation Age of Onset  . Diabetes Mother        type 2    Social History Social History   Tobacco Use  . Smoking status: Current Every Day Smoker    Packs/day: 0.50    Types: Cigarettes  . Smokeless tobacco: Never Used  Substance Use Topics  . Alcohol use: Yes    Alcohol/week: 0.0 oz    Comment: occasional use  . Drug use: No    Review of Systems Constitutional: No fever/chills Eyes: No visual changes. ENT: Congestion, "itchy" throat Cardiovascular: Denies chest pain. Respiratory: Mild shortness of breath worse while sleeping, cough, tonight productive of blood Gastrointestinal: No abdominal pain.  No nausea, no vomiting.  No diarrhea.  No constipation. Genitourinary: Negative for dysuria. Musculoskeletal: Negative for neck pain.  Negative for back pain. Integumentary: Negative for rash. Neurological: Negative for  headaches, focal weakness or numbness. Psychiatric:No complaints, normal mood and affect  ____________________________________________   PHYSICAL EXAM:  VITAL SIGNS: ED Triage Vitals  Enc Vitals Group     BP 07/12/17 2359 (!) 154/108     Pulse Rate 07/12/17 2359 (!) 101     Resp 07/12/17 2359 20     Temp 07/12/17 2359 98.5 F (36.9 C)     Temp Source 07/12/17 2359 Oral     SpO2 07/12/17 2359 98 %     Weight 07/13/17 0000 120.2 kg (265 lb)     Height  07/13/17 0000 1.702 m (5\' 7" )     Head Circumference --      Peak Flow --      Pain Score --      Pain Loc --      Pain Edu? --      Excl. in GC? --     Constitutional: Alert and oriented. Well appearing and in no acute distress. Eyes: Conjunctivae are normal.  Head: Atraumatic. Nose: +congestion/rhinnorhea.  Mouth/Throat: Mucous membranes are moist.  Oropharynx is nonerythematous with no evidence of blood, dried or otherwise, in his oropharynx. Neck: No stridor.  No meningeal signs.   Cardiovascular: Normal rate, regular rhythm. Good peripheral circulation. Grossly normal heart sounds. Respiratory: Normal respiratory effort.  No retractions. Lungs CTAB. Gastrointestinal: Morbid obesity.  Soft and nontender. No distention.  Musculoskeletal: No lower extremity tenderness nor edema. No gross deformities of extremities. Neurologic:  Normal speech and language. No gross focal neurologic deficits are appreciated.  Skin:  Skin is warm, dry and intact. No rash noted. Psychiatric: Mood and affect are normal. Speech and behavior are normal.  ____________________________________________   LABS (all labs ordered are listed, but only abnormal results are displayed)  Labs Reviewed  CBC WITH DIFFERENTIAL/PLATELET - Abnormal; Notable for the following components:      Result Value   MCV 79.1 (*)    Neutro Abs 6.7 (*)    All other components within normal limits  COMPREHENSIVE METABOLIC PANEL - Abnormal; Notable for the following components:   Glucose, Bld 158 (*)    Creatinine, Ser 1.36 (*)    Calcium 8.8 (*)    All other components within normal limits  PROTIME-INR  APTT  INFLUENZA PANEL BY PCR (TYPE A & B)   ____________________________________________  EKG  None - EKG not ordered by ED physician ____________________________________________  RADIOLOGY Marylou MccoyI, Areliz Rothman, personally viewed and evaluated these images (plain radiographs) as part of my medical decision making, as well  as reviewing the written report by the radiologist.  ED MD interpretation: No acute abnormalities  Official radiology report(s): Dg Chest 2 View  Result Date: 07/13/2017 CLINICAL DATA:  37 y/o  M; cough with hemoptysis. EXAM: CHEST  2 VIEW COMPARISON:  05/26/2017 chest radiograph FINDINGS: Stable heart size and mediastinal contours are within normal limits. Both lungs are clear. The visualized skeletal structures are unremarkable. IMPRESSION: No acute pulmonary process identified. Electronically Signed   By: Mitzi HansenLance  Furusawa-Stratton M.D.   On: 07/13/2017 01:08   Ct Chest Wo Contrast  Result Date: 07/13/2017 CLINICAL DATA:  37 y/o  M; hemoptysis and cough. EXAM: CT CHEST WITHOUT CONTRAST TECHNIQUE: Multidetector CT imaging of the chest was performed following the standard protocol without IV contrast. COMPARISON:  None. FINDINGS: Cardiovascular: No significant vascular findings. Normal heart size. No pericardial effusion. Mediastinum/Nodes: No enlarged mediastinal or axillary lymph nodes. Thyroid gland, trachea, and esophagus demonstrate no significant findings. Lungs/Pleura: Few punctate calcified  granulomas in the lung bases. Lungs are otherwise clear. No pleural effusion or pneumothorax. Upper Abdomen: No acute abnormality. Musculoskeletal: No chest wall mass or suspicious bone lesions identified. Mild gynecomastia. IMPRESSION: 1. No acute process identified. 2. Few punctate calcified granulomas in the lung bases. Electronically Signed   By: Mitzi Hansen M.D.   On: 07/13/2017 02:59    ____________________________________________   PROCEDURES  Critical Care performed: No   Procedure(s) performed:   Procedures   ____________________________________________   INITIAL IMPRESSION / ASSESSMENT AND PLAN / ED COURSE  As part of my medical decision making, I reviewed the following data within the electronic MEDICAL RECORD NUMBER History obtained from family, Nursing notes reviewed and  incorporated, Labs reviewed , Radiograph reviewed  and Notes from prior ED visits    Differential diagnosis includes, but is not limited to, viral infection including but not limited to influenza, community-acquired pneumonia, bronchitis.  If he actually vomited blood as opposed to having hemoptysis, we must consider Mallory-Weiss tear and esophageal varices.  Even though he has a history of binge drinking he is only 37 years old and he and his family all promised that he has not had any alcohol for at least 6 months, so I think that esophageal varices or other similar acute/emergent condition is very unlikely.  He is having no persistent nausea or vomiting which also makes me think it is much more likely, particularly in this time of high influenza prevalence, that he is suffering from a bronchitis which is leading to hemoptysis.  Based on his body habitus I suspect he has sleep apnea which is been exacerbated by a viral illness.  I will check blood work including influenza panel and a chest x-ray and reassess, but I suspect he will not need additional tonight.  Clinical Course as of Jul 13 312  Sat Jul 13, 2017  1610 CT scan was unremarkable.  Negative, but I believe that he has a respiratory virus which led to the symptoms.  He has been stable throughout 3+ hours of observation in the emergency department.  I believe there is no acute or emergent medical condition.  I updated the patient's family and encouraged him to follow-up as an outpatient so that he can possibly have a sleep study because I believe that he most likely does suffer from sleep apnea.  I gave my usual and customary viral respiratory illness recommendations.  [CF]    Clinical Course User Index [CF] Loleta Rose, MD    ____________________________________________  FINAL CLINICAL IMPRESSION(S) / ED DIAGNOSES  Final diagnoses:  Viral URI with cough  Hemoptysis     MEDICATIONS GIVEN DURING THIS VISIT:  Medications - No  data to display   ED Discharge Orders    None       Note:  This document was prepared using Dragon voice recognition software and may include unintentional dictation errors.    Loleta Rose, MD 07/13/17 [id number]

## 2017-07-13 NOTE — Discharge Instructions (Signed)

## 2017-07-13 NOTE — ED Notes (Signed)
Pt reports took OTC musinex for cough

## 2017-08-01 ENCOUNTER — Encounter: Payer: Self-pay | Admitting: Emergency Medicine

## 2017-08-01 ENCOUNTER — Emergency Department
Admission: EM | Admit: 2017-08-01 | Discharge: 2017-08-01 | Disposition: A | Discharge status: Home / Self Care | Payer: Self-pay | Attending: Emergency Medicine | Admitting: Emergency Medicine

## 2017-08-01 ENCOUNTER — Emergency Department: Payer: Self-pay

## 2017-08-01 ENCOUNTER — Other Ambulatory Visit: Payer: Self-pay

## 2017-08-01 DIAGNOSIS — F17210 Nicotine dependence, cigarettes, uncomplicated: Secondary | ICD-10-CM | POA: Insufficient documentation

## 2017-08-01 DIAGNOSIS — R042 Hemoptysis: Secondary | ICD-10-CM | POA: Insufficient documentation

## 2017-08-01 DIAGNOSIS — I10 Essential (primary) hypertension: Secondary | ICD-10-CM | POA: Insufficient documentation

## 2017-08-01 DIAGNOSIS — Z79899 Other long term (current) drug therapy: Secondary | ICD-10-CM | POA: Insufficient documentation

## 2017-08-01 DIAGNOSIS — R739 Hyperglycemia, unspecified: Secondary | ICD-10-CM | POA: Insufficient documentation

## 2017-08-01 LAB — CBC WITH DIFFERENTIAL/PLATELET
BASOS ABS: 0.1 10*3/uL (ref 0–0.1)
Basophils Relative: 1 %
EOS PCT: 1 %
Eosinophils Absolute: 0.1 10*3/uL (ref 0–0.7)
HCT: 41.3 % (ref 40.0–52.0)
Hemoglobin: 13.8 g/dL (ref 13.0–18.0)
LYMPHS PCT: 23 %
Lymphs Abs: 1.9 10*3/uL (ref 1.0–3.6)
MCH: 26.1 pg (ref 26.0–34.0)
MCHC: 33.5 g/dL (ref 32.0–36.0)
MCV: 78 fL — AB (ref 80.0–100.0)
MONO ABS: 0.5 10*3/uL (ref 0.2–1.0)
MONOS PCT: 6 %
Neutro Abs: 5.7 10*3/uL (ref 1.4–6.5)
Neutrophils Relative %: 69 %
Platelets: 172 10*3/uL (ref 150–440)
RBC: 5.29 MIL/uL (ref 4.40–5.90)
RDW: 13.7 % (ref 11.5–14.5)
WBC: 8.2 10*3/uL (ref 3.8–10.6)

## 2017-08-01 LAB — FIBRIN DERIVATIVES D-DIMER (ARMC ONLY): Fibrin derivatives D-dimer (ARMC): 158.18 ng/mL (FEU) (ref 0.00–499.00)

## 2017-08-01 LAB — COMPREHENSIVE METABOLIC PANEL
ALBUMIN: 3.3 g/dL — AB (ref 3.5–5.0)
ALK PHOS: 76 U/L (ref 38–126)
ALT: 17 U/L (ref 17–63)
AST: 17 U/L (ref 15–41)
Anion gap: 8 (ref 5–15)
BILIRUBIN TOTAL: 0.6 mg/dL (ref 0.3–1.2)
BUN: 10 mg/dL (ref 6–20)
CALCIUM: 8.3 mg/dL — AB (ref 8.9–10.3)
CO2: 24 mmol/L (ref 22–32)
Chloride: 104 mmol/L (ref 101–111)
Creatinine, Ser: 1.14 mg/dL (ref 0.61–1.24)
GFR calc Af Amer: >60 mL/min (ref >60)
GFR calc non Af Amer: >60 mL/min (ref >60)
GLUCOSE: 316 mg/dL — AB (ref 65–99)
POTASSIUM: 3.9 mmol/L (ref 3.5–5.1)
Sodium: 136 mmol/L (ref 135–145)
TOTAL PROTEIN: 6.7 g/dL (ref 6.5–8.1)

## 2017-08-01 LAB — TROPONIN I

## 2017-08-01 LAB — ETHANOL: Alcohol, Ethyl (B): <10 mg/dL (ref <10)

## 2017-08-01 LAB — LIPASE, BLOOD: Lipase: 46 U/L (ref 11–51)

## 2017-08-01 MED ORDER — ALBUTEROL SULFATE HFA 108 (90 BASE) MCG/ACT IN AERS: 2.0000 | INHALATION_SPRAY | Freq: Four times a day (QID) | RESPIRATORY_TRACT | 0 refills | Status: AC | PRN

## 2017-08-01 NOTE — ED Provider Notes (Signed)
[facility] Emergency Department Provider Note ____________________________________________   I have reviewed the triage vital signs and the triage nursing note.  HISTORY  Chief Complaint Tremors and Hemoptysis   Historian Patient  HPI Dalton Foster is a 37 y.o. male presenting for complaint of coughing up blood.  Patient states that for several weeks now he has had a cough which is sometimes stronger than others, and sometimes he will cough and there is no blood there, sometimes he will cough and there is streaks of blood sometimes there is slightly more than that.  Patient states he still wants to know what is causing this.  He was seen 3  weeks ago approximately in the ER for the same complaint and was ultimately discharged.  Patient denies dizziness or passing out.  He denies shortness of breath.  He denies wheezing.  He denies chest pain or any pleuritic chest pain.  He denies fevers.  Does not use inhalers.     Past Medical History:  Diagnosis Date  . History of chicken pox   . Hypertension     Patient Active Problem List   Diagnosis Date Noted  . Essential (primary) hypertension 12/01/2015  . Headache 12/01/2015  . Tobacco abuse 12/01/2015  . Insomnia 12/01/2015    Past Surgical History:  Procedure Laterality Date  . FRACTURE SURGERY  2012   reduction of closed fracture    Prior to Admission medications   Medication Sig Start Date End Date Taking? Authorizing Provider  brompheniramine-pseudoephedrine-DM 30-2-10 MG/5ML syrup Take 10 mLs by mouth 4 (four) times daily as needed. Patient not taking: Reported on 08/01/2017 05/26/17   Cuthriell, Delorise Royals, PA-C  fluticasone [facility]) 50 MCG/ACT nasal spray Place 1 spray into both nostrils 2 (two) times daily. Patient not taking: Reported on 08/01/2017 05/26/17   Cuthriell, Delorise Royals, PA-C  hydrochlorothiazide (HYDRODIURIL) 25 MG tablet Take 1 tablet (25 mg total) by mouth daily.  Reported on 12/09/2015 Patient not taking: Reported on 08/01/2017 11/09/16   Anola Gurney, PA    No Known Allergies  Family History  Problem Relation Age of Onset  . Diabetes Mother        type 2    Social History Social History   Tobacco Use  . Smoking status: Current Every Day Smoker    Packs/day: 0.50    Types: Cigarettes  . Smokeless tobacco: Never Used  Substance Use Topics  . Alcohol use: Yes    Alcohol/week: 0.0 oz    Comment: occasional use  . Drug use: No    Review of Systems  Constitutional: Negative for fever. Eyes: Negative for visual changes. ENT: Negative for sore throat.  No nosebleeds. Cardiovascular: Negative for chest pain. Respiratory: Negative for shortness of breath, but positive for cough and occasional hemoptysis as per HPI. Gastrointestinal: Negative for abdominal pain, vomiting and diarrhea. Genitourinary: Negative for dysuria. Musculoskeletal: Negative for back pain. Skin: Negative for rash. Neurological: Negative for headache.  ____________________________________________   PHYSICAL EXAM:  VITAL SIGNS: ED Triage Vitals  Enc Vitals Group     BP 08/01/17 0721 (!) 159/113     Pulse Rate 08/01/17 0721 (!) 113     Resp 08/01/17 0721 20     Temp 08/01/17 0721 98.3 F (36.8 C)     Temp Source 08/01/17 0721 Oral     SpO2 08/01/17 0721 99 %     Weight 08/01/17 0722 265 lb (120.2 kg)     Height 08/01/17 0722 5\' 7"  (  1.702 m)     Head Circumference --      Peak Flow --      Pain Score 08/01/17 0722 5     Pain Loc --      Pain Edu? --      Excl. in GC? --      Constitutional: Alert and oriented. Well appearing and in no distress. HEENT   Head: Normocephalic and atraumatic.      Eyes: Conjunctivae are normal. Pupils equal and round.       Ears:         Nose: No congestion/rhinnorhea.   Mouth/Throat: Mucous membranes are moist.   Neck: No stridor. Cardiovascular/Chest: Normal rate, regular rhythm.  No murmurs, rubs, or  gallops. Respiratory: Normal respiratory effort without tachypnea nor retractions. Breath sounds are clear and equal bilaterally. No wheezes/rales/rhonchi. Gastrointestinal: Soft. No distention, no guarding, no rebound. Nontender.  Obese. Genitourinary/rectal:Deferred Musculoskeletal: Nontender with normal range of motion in all extremities. No joint effusions.  No lower extremity tenderness.  No edema. Neurologic:  Normal speech and language. No gross or focal neurologic deficits are appreciated. Skin:  Skin is warm, dry and intact. No rash noted. Psychiatric: Mood and affect are normal. Speech and behavior are normal. Patient exhibits appropriate insight and judgment.   ____________________________________________  LABS (pertinent positives/negatives) I, Governor Rooksebecca Octa Uplinger, MD the attending physician have reviewed the labs noted below.  Labs Reviewed  COMPREHENSIVE METABOLIC PANEL - Abnormal; Notable for the following components:      Result Value   Glucose, Bld 316 (*)    Calcium 8.3 (*)    Albumin 3.3 (*)    All other components within normal limits  CBC WITH DIFFERENTIAL/PLATELET - Abnormal; Notable for the following components:   MCV 78.0 (*)    All other components within normal limits  ETHANOL  LIPASE, BLOOD  TROPONIN I  FIBRIN DERIVATIVES D-DIMER ([facility] ONLY)    ____________________________________________    EKG I, Governor Rooksebecca Lujean Ebright, MD, the attending physician have personally viewed and interpreted all ECGs.  97 bpm.  Normal sinus rhythm.  Narrow QS.  Right axis.  Nonspecific ST and T wave. ____________________________________________  RADIOLOGY   Chest x-ray radiologist report:IMPRESSION: Low lung volumes with mild basilar atelectasis. __________________________________________  PROCEDURES  Procedure(s) performed: None  Critical Care performed: None   ____________________________________________  ED COURSE / ASSESSMENT AND PLAN  Pertinent labs & imaging  results that were available during my care of the patient were reviewed by me and considered in my medical decision making (see chart for details).   The patient is overall well-appearing with stable vital signs, clear lungs on exam.  He describes what sounds like relatively small amount of hemoptysis which has been intermittent for several weeks.  I was able to review his ED chart from several weeks ago, he had a negative chest x-ray and a negative noncontrast CT of the chest and was felt to be having slight or mild hemoptysis from bronchitis.  Vital signs are stable here.  No hypoxia.  No tachycardia or hypotension.  On laboratory studies his hemoglobin is 13.8.  His troponin is normal.  His d-dimer is not elevated.  His chest x-ray is clear.  On exam he is overall reassuring.  It does not sound like this is vomiting blood, but a small amount of hemoptysis.  I am again asking him to follow with primary care doctor.  I will also provide office number for pulmonology.  We discussed return precautions.  We discussed  elevated blood sugar, need for further investigation with primary care doctor.  DIFFERENTIAL DIAGNOSIS: Including but not limited to bronchitis, nosebleed, GI bleeding, PE, Mallory-Weiss, etc.  CONSULTATIONS: None   Patient / Family / Caregiver informed of clinical course, medical decision-making process, and agree with plan.   I discussed return precautions, follow-up instructions, and discharge instructions with patient and/or family.  Discharge Instructions : You are evaluated for occasional cough with blood in it, and although no certain cause was found, your exam and evaluation are overall reassuring in the emergency department today.  We discussed again that the most likely cause would be irritation from coughing, however given persistence, I do recommend that you see a primary care doctor and follow-up, and consider lung specialist, pulmonologist  consultation.  Return to the emergency department immediately for any worsening condition including chest pain, new or larger amount of blood, vomiting blood, abdominal pain, black or bloody stools, fever, dizziness or passing out, palpitations, shortness of breath or any other symptoms concerning to you.    ___________________________________________   FINAL CLINICAL IMPRESSION(S) / ED DIAGNOSES   Final diagnoses:  Hemoptysis  Hyperglycemia      ___________________________________________        Note: This dictation was prepared with Dragon dictation. Any transcriptional errors that result from this process are unintentional    Governor Rooks, MD 08/01/17 1237

## 2017-08-01 NOTE — ED Notes (Signed)

## 2017-08-01 NOTE — ED Triage Notes (Signed)
Pt states he started coughing up blood this morning, states he has coughed up blood in the past. Pt states this time it was only a streak of blood.  Pt also states he has had the shakes and doesn't feel right since about December. Pt reports hx of etoh use, but states he has not used since last year.  Pt has tremor to left arm noticeable in triage.

## 2017-08-01 NOTE — Discharge Instructions (Signed)
You are evaluated for occasional cough with blood in it, and although no certain cause was found, your exam and evaluation are overall reassuring in the emergency department today.  We discussed again that the most likely cause would be irritation from coughing, however given persistence, I do recommend that you see a primary care doctor and follow-up, and consider lung specialist, pulmonologist consultation.  Return to the emergency department immediately for any worsening condition including chest pain, new or larger amount of blood, vomiting blood, abdominal pain, black or bloody stools, fever, dizziness or passing out, palpitations, shortness of breath or any other symptoms concerning to you.

## 2017-08-26 ENCOUNTER — Encounter: Payer: Self-pay | Admitting: *Deleted

## 2017-08-26 ENCOUNTER — Emergency Department
Admission: EM | Admit: 2017-08-26 | Discharge: 2017-08-26 | Disposition: A | Discharge status: Home / Self Care | Payer: Self-pay | Attending: Emergency Medicine | Admitting: Emergency Medicine

## 2017-08-26 ENCOUNTER — Other Ambulatory Visit: Payer: Self-pay

## 2017-08-26 DIAGNOSIS — F17210 Nicotine dependence, cigarettes, uncomplicated: Secondary | ICD-10-CM | POA: Insufficient documentation

## 2017-08-26 DIAGNOSIS — E119 Type 2 diabetes mellitus without complications: Secondary | ICD-10-CM | POA: Insufficient documentation

## 2017-08-26 DIAGNOSIS — Z79899 Other long term (current) drug therapy: Secondary | ICD-10-CM | POA: Insufficient documentation

## 2017-08-26 DIAGNOSIS — I10 Essential (primary) hypertension: Secondary | ICD-10-CM | POA: Insufficient documentation

## 2017-08-26 DIAGNOSIS — R531 Weakness: Secondary | ICD-10-CM | POA: Insufficient documentation

## 2017-08-26 LAB — COMPREHENSIVE METABOLIC PANEL
ALBUMIN: 3.7 g/dL (ref 3.5–5.0)
ALT: 19 U/L (ref 17–63)
ANION GAP: 11 (ref 5–15)
AST: 18 U/L (ref 15–41)
Alkaline Phosphatase: 91 U/L (ref 38–126)
BILIRUBIN TOTAL: 0.9 mg/dL (ref 0.3–1.2)
BUN: 10 mg/dL (ref 6–20)
CO2: 25 mmol/L (ref 22–32)
Calcium: 8.6 mg/dL — ABNORMAL LOW (ref 8.9–10.3)
Chloride: 99 mmol/L — ABNORMAL LOW (ref 101–111)
Creatinine, Ser: 1.28 mg/dL — ABNORMAL HIGH (ref 0.61–1.24)
GFR calc Af Amer: >60 mL/min (ref >60)
GFR calc non Af Amer: >60 mL/min (ref >60)
GLUCOSE: 343 mg/dL — AB (ref 65–99)
Potassium: 3.8 mmol/L (ref 3.5–5.1)
SODIUM: 135 mmol/L (ref 135–145)
TOTAL PROTEIN: 7.4 g/dL (ref 6.5–8.1)

## 2017-08-26 LAB — CBC
HCT: 43.5 % (ref 40.0–52.0)
Hemoglobin: 14.4 g/dL (ref 13.0–18.0)
MCH: 25.6 pg — ABNORMAL LOW (ref 26.0–34.0)
MCHC: 33 g/dL (ref 32.0–36.0)
MCV: 77.5 fL — ABNORMAL LOW (ref 80.0–100.0)
Platelets: 160 10*3/uL (ref 150–440)
RBC: 5.62 MIL/uL (ref 4.40–5.90)
RDW: 14 % (ref 11.5–14.5)
WBC: 8.6 10*3/uL (ref 3.8–10.6)

## 2017-08-26 LAB — GLUCOSE, CAPILLARY: GLUCOSE-CAPILLARY: 203 mg/dL — AB (ref 65–99)

## 2017-08-26 LAB — URINALYSIS, COMPLETE (UACMP) WITH MICROSCOPIC
BILIRUBIN URINE: NEGATIVE
Bacteria, UA: NONE SEEN
Glucose, UA: >=500 mg/dL — AB
Ketones, ur: NEGATIVE mg/dL
LEUKOCYTES UA: NEGATIVE
NITRITE: NEGATIVE
PROTEIN: 100 mg/dL — AB
Specific Gravity, Urine: 1.03 (ref 1.005–1.030)
pH: 5 (ref 5.0–8.0)

## 2017-08-26 LAB — LIPASE, BLOOD: LIPASE: 24 U/L (ref 11–51)

## 2017-08-26 MED ORDER — INSULIN ASPART 100 UNIT/ML ~~LOC~~ SOLN
SUBCUTANEOUS | Status: AC
  Filled 2017-08-26: qty 1

## 2017-08-26 MED ORDER — SODIUM CHLORIDE 0.9 % IV BOLUS
1000.0000 mL | Freq: Once | INTRAVENOUS | Status: AC
  Administered 2017-08-26: 1000 mL via INTRAVENOUS

## 2017-08-26 MED ORDER — INSULIN ASPART 100 UNIT/ML ~~LOC~~ SOLN
6.0000 [IU] | Freq: Once | SUBCUTANEOUS | Status: AC
  Administered 2017-08-26: 6 [IU] via INTRAVENOUS
  Filled 2017-08-26: qty 0.06

## 2017-08-26 MED ORDER — METFORMIN HCL 500 MG PO TABS: 500.0000 mg | ORAL_TABLET | Freq: Two times a day (BID) | ORAL | 1 refills | Status: DC

## 2017-08-26 MED ORDER — ONDANSETRON 4 MG PO TBDP
4.0000 mg | ORAL_TABLET | Freq: Once | ORAL | Status: AC
  Administered 2017-08-26: 4 mg via ORAL
  Filled 2017-08-26: qty 1

## 2017-08-26 MED ORDER — ACETAMINOPHEN 325 MG PO TABS
650.0000 mg | ORAL_TABLET | Freq: Once | ORAL | Status: AC
  Administered 2017-08-26: 650 mg via ORAL
  Filled 2017-08-26: qty 2

## 2017-08-26 NOTE — ED Notes (Signed)
Pt signed esignature.  D/c  inst to pt.  

## 2017-08-26 NOTE — ED Notes (Signed)
Pt reports feeling weak and tired with nausea.  No abd pain now.  No diarrhea.  Pt states thirsty and frequent urination.

## 2017-08-26 NOTE — ED Notes (Signed)
Pt at [facility] nurse desk, drinking red gatorade, states that he ate a poptart and suddenly felt sick on his stomach and weak.

## 2017-08-26 NOTE — ED Notes (Signed)
ED Provider at bedside. 

## 2017-08-26 NOTE — ED Provider Notes (Signed)
[facility] Emergency Department Provider Note  Time seen: 7:45 PM  I have reviewed the triage vital signs and the nursing notes.   HISTORY  Chief Complaint Weakness    HPI Dalton Foster is a 37 y.o. male with a past medical history of hypertension, presents to the emergency department for feeling fatigued.  According to the patient he has been feeling fatigued today.  States he was at work and he try to take a nap at work but continued to feel fatigued so he left work and came to the emergency department.  Patient states he has been drinking a lot of fluids and has been urinating a lot.  Denies history of diabetes.  Denies any cough or congestion.  Had one episode of vomiting while waiting to be seen in the emergency department, no vomiting otherwise.  Denies any nausea currently but did receive Zofran.  No diarrhea.  No dysuria or hematuria but has been urinating frequently.   Past Medical History:  Diagnosis Date  . History of chicken pox   . Hypertension     Patient Active Problem List   Diagnosis Date Noted  . Essential (primary) hypertension 12/01/2015  . Headache 12/01/2015  . Tobacco abuse 12/01/2015  . Insomnia 12/01/2015    Past Surgical History:  Procedure Laterality Date  . FRACTURE SURGERY  2012   reduction of closed fracture    Prior to Admission medications   Medication Sig Start Date End Date Taking? Authorizing Provider  albuterol (PROVENTIL HFA;VENTOLIN HFA) 108 (90 Base) MCG/ACT inhaler Inhale 2 puffs into the lungs every 6 (six) hours as needed for wheezing or shortness of breath. 08/01/17   Governor Rooks, MD  brompheniramine-pseudoephedrine-DM 30-2-10 MG/5ML syrup Take 10 mLs by mouth 4 (four) times daily as needed. Patient not taking: Reported on 08/01/2017 05/26/17   Cuthriell, Delorise Royals, PA-C  fluticasone [facility]) 50 MCG/ACT nasal spray Place 1 spray into both nostrils 2 (two) times daily. Patient not taking: Reported on  08/01/2017 05/26/17   Cuthriell, Delorise Royals, PA-C  hydrochlorothiazide (HYDRODIURIL) 25 MG tablet Take 1 tablet (25 mg total) by mouth daily. Reported on 12/09/2015 Patient not taking: Reported on 08/01/2017 11/09/16   Anola Gurney, PA    No Known Allergies  Family History  Problem Relation Age of Onset  . Diabetes Mother        type 2    Social History Social History   Tobacco Use  . Smoking status: Current Every Day Smoker    Packs/day: 0.50    Types: Cigarettes  . Smokeless tobacco: Never Used  Substance Use Topics  . Alcohol use: Yes    Alcohol/week: 0.0 oz    Comment: occasional use  . Drug use: No    Review of Systems Constitutional: Negative for fever.  Positive generalized fatigue.  Low-grade fever in the emergency department, unknown to patient. Eyes: Negative for visual complaints  ENT: Negative for recent illness/congestion Cardiovascular: Negative for chest pain. Respiratory: Negative for shortness of breath. Gastrointestinal: Negative for abdominal pain, vomiting and diarrhea. Genitourinary: Positive urinary frequency Musculoskeletal: Negative for musculoskeletal complaints Skin: Negative for skin complaints  Neurological: Negative for headache All other ROS negative  ____________________________________________   PHYSICAL EXAM:  VITAL SIGNS: ED Triage Vitals  Enc Vitals Group     BP 08/26/17 1909 (!) 152/92     Pulse Rate 08/26/17 1909 (!) 115     Resp 08/26/17 1909 18     Temp 08/26/17 1909 (!) 100.9  F (38.3 C)     Temp Source 08/26/17 1909 Oral     SpO2 08/26/17 1909 100 %     Weight 08/26/17 1909 265 lb (120.2 kg)     Height 08/26/17 1909 5\' 7"  (1.702 m)     Head Circumference --      Peak Flow --      Pain Score 08/26/17 1907 7     Pain Loc --      Pain Edu? --      Excl. in GC? --     Constitutional: Alert and oriented. Well appearing and in no distress. Eyes: Normal exam ENT   Head: Normocephalic and atraumatic.    Mouth/Throat: Mucous membranes are moist. Cardiovascular: Normal rate, regular rhythm. No murmur Respiratory: Normal respiratory effort without tachypnea nor retractions. Breath sounds are clear Gastrointestinal: Soft and nontender. No distention.  Musculoskeletal: Nontender with normal range of motion in all extremities.  Neurologic:  Normal speech and language. No gross focal neurologic deficits  Skin:  Skin is warm, dry and intact.  Psychiatric: Mood and affect are normal.   ____________________________________________    EKG  EKG reviewed and interpreted by myself shows sinus tachycardia 116 bpm, narrow QRS, normal axis, normal intervals, nonspecific but no concerning ST changes.  ____________________________________________    INITIAL IMPRESSION / ASSESSMENT AND PLAN / ED COURSE  Pertinent labs & imaging results that were available during my care of the patient were reviewed by me and considered in my medical decision making (see chart for details).  Patient presents to the emergency department for generalized fatigue and weakness.  Differential is quite broad but would include metabolic/electrolyte abnormality, illness/infection such as viral illness.  Patient also reports frequent urination, differential would include urinary tract infection, diabetes.  Patient's labs are resulted showing an elevated blood glucose 340 with greater than 500 glucose in the urine.  Patient denies any history of diabetes.  Likely new onset diabetes.  Patient also has a low-grade temperature 100.9 in the emergency department.  With his generalized fatigue and one episode of nausea vomiting in the emergency department patient could very likely be experiencing a viral illness.  He has a nontender abdomen, no cough or congestion, no signs of urinary tract infection.  We will start an IV, IV hydrate and dose a one-time dose of insulin to reduce the patient's blood glucose.  We will start the patient on  metformin and have him follow-up with his doctor tomorrow.  I discussed this plan of care with the patient and he is agreeable.  I also discussed return precautions for any abdominal pain, chest pain, cough or dysuria.  His blood glucose down to approximately 200.  Patient continues to feel well in the emergency department.  Appears well.  We will discharge patient home on metformin with PCP follow-up.  Patient agreeable this plan of care.  ____________________________________________   FINAL CLINICAL IMPRESSION(S) / ED DIAGNOSES  New onset diabetes Viral illness   Minna AntisPaduchowski, Demarious Kapur, MD 08/26/17 2202

## 2017-08-26 NOTE — Discharge Instructions (Addendum)
Please follow-up with your doctor as soon as possible for reevaluation.  Please drink plenty of fluids avoid sugar such as soda, sweets Tea, candy.  Please avoid white bread and white pasta, and limit fruit intake.  Eat plenty of vegetables, meat.  Return to the emergency department for any increased weakness, any other symptom personally concerning to yourself.

## 2017-08-26 NOTE — ED Triage Notes (Signed)
Pt reports he has just been "feeling weak and drowsy all day." Reports nausea all day, 1 episode of vomiting. Left shoulder pain.  No meds PTA.

## 2018-01-10 ENCOUNTER — Ambulatory Visit
Admission: RE | Admit: 2018-01-10 | Discharge: 2018-01-10 | Disposition: A | Discharge status: Home / Self Care | Payer: Self-pay | Source: Ambulatory Visit | Attending: Family Medicine | Admitting: Family Medicine

## 2018-01-10 ENCOUNTER — Ambulatory Visit: Payer: Self-pay

## 2018-01-10 DIAGNOSIS — W19XXXA Unspecified fall, initial encounter: Secondary | ICD-10-CM | POA: Insufficient documentation

## 2018-01-10 DIAGNOSIS — M25511 Pain in right shoulder: Secondary | ICD-10-CM | POA: Insufficient documentation

## 2018-06-25 ENCOUNTER — Encounter: Payer: Self-pay | Admitting: *Deleted

## 2018-06-25 ENCOUNTER — Emergency Department: Payer: Self-pay

## 2018-06-25 ENCOUNTER — Other Ambulatory Visit: Payer: Self-pay

## 2018-06-25 ENCOUNTER — Emergency Department
Admission: EM | Admit: 2018-06-25 | Discharge: 2018-06-25 | Disposition: A | Discharge status: Home / Self Care | Payer: Self-pay | Attending: Emergency Medicine | Admitting: Emergency Medicine

## 2018-06-25 DIAGNOSIS — K047 Periapical abscess without sinus: Secondary | ICD-10-CM | POA: Insufficient documentation

## 2018-06-25 DIAGNOSIS — I10 Essential (primary) hypertension: Secondary | ICD-10-CM | POA: Insufficient documentation

## 2018-06-25 DIAGNOSIS — K0889 Other specified disorders of teeth and supporting structures: Secondary | ICD-10-CM

## 2018-06-25 DIAGNOSIS — Z7984 Long term (current) use of oral hypoglycemic drugs: Secondary | ICD-10-CM | POA: Insufficient documentation

## 2018-06-25 DIAGNOSIS — F17210 Nicotine dependence, cigarettes, uncomplicated: Secondary | ICD-10-CM | POA: Insufficient documentation

## 2018-06-25 DIAGNOSIS — R1310 Dysphagia, unspecified: Secondary | ICD-10-CM | POA: Insufficient documentation

## 2018-06-25 DIAGNOSIS — K029 Dental caries, unspecified: Secondary | ICD-10-CM | POA: Insufficient documentation

## 2018-06-25 LAB — CBC WITH DIFFERENTIAL/PLATELET
Abs Immature Granulocytes: 0.06 10*3/uL (ref 0.00–0.07)
BASOS ABS: 0 10*3/uL (ref 0.0–0.1)
Basophils Relative: 0 %
EOS ABS: 0.1 10*3/uL (ref 0.0–0.5)
EOS PCT: 0 %
HCT: 48 % (ref 39.0–52.0)
HEMOGLOBIN: 15 g/dL (ref 13.0–17.0)
Immature Granulocytes: 0 %
LYMPHS PCT: 15 %
Lymphs Abs: 2 10*3/uL (ref 0.7–4.0)
MCH: 25.6 pg — ABNORMAL LOW (ref 26.0–34.0)
MCHC: 31.3 g/dL (ref 30.0–36.0)
MCV: 82.1 fL (ref 80.0–100.0)
Monocytes Absolute: 0.9 10*3/uL (ref 0.1–1.0)
Monocytes Relative: 6 %
NRBC: 0 % (ref 0.0–0.2)
Neutro Abs: 10.4 10*3/uL — ABNORMAL HIGH (ref 1.7–7.7)
Neutrophils Relative %: 79 %
Platelets: 185 10*3/uL (ref 150–400)
RBC: 5.85 MIL/uL — AB (ref 4.22–5.81)
RDW: 13.1 % (ref 11.5–15.5)
WBC: 13.4 10*3/uL — AB (ref 4.0–10.5)

## 2018-06-25 LAB — COMPREHENSIVE METABOLIC PANEL
ALBUMIN: 4 g/dL (ref 3.5–5.0)
ALK PHOS: 66 U/L (ref 38–126)
ALT: 19 U/L (ref 0–44)
AST: 16 U/L (ref 15–41)
Anion gap: 9 (ref 5–15)
BUN: 10 mg/dL (ref 6–20)
CALCIUM: 8.7 mg/dL — AB (ref 8.9–10.3)
CO2: 26 mmol/L (ref 22–32)
Chloride: 102 mmol/L (ref 98–111)
Creatinine, Ser: 1.25 mg/dL — ABNORMAL HIGH (ref 0.61–1.24)
GFR calc non Af Amer: >60 mL/min (ref >60)
Glucose, Bld: 115 mg/dL — ABNORMAL HIGH (ref 70–99)
Potassium: 4 mmol/L (ref 3.5–5.1)
SODIUM: 137 mmol/L (ref 135–145)
Total Bilirubin: 0.9 mg/dL (ref 0.3–1.2)
Total Protein: 7.3 g/dL (ref 6.5–8.1)

## 2018-06-25 MED ORDER — ONDANSETRON HCL 4 MG/2ML IJ SOLN
4.0000 mg | Freq: Once | INTRAMUSCULAR | Status: AC
  Administered 2018-06-25: 4 mg via INTRAVENOUS

## 2018-06-25 MED ORDER — IOHEXOL 300 MG/ML  SOLN
75.0000 mL | Freq: Once | INTRAMUSCULAR | Status: AC | PRN
  Administered 2018-06-25: 75 mL via INTRAVENOUS

## 2018-06-25 MED ORDER — OXYCODONE-ACETAMINOPHEN 5-325 MG PO TABS: 1.0000 | ORAL_TABLET | ORAL | 0 refills | Status: DC | PRN

## 2018-06-25 MED ORDER — CLINDAMYCIN HCL 300 MG PO CAPS: 300.0000 mg | ORAL_CAPSULE | Freq: Three times a day (TID) | ORAL | 0 refills | Status: AC

## 2018-06-25 MED ORDER — FENTANYL CITRATE (PF) 100 MCG/2ML IJ SOLN
50.0000 ug | Freq: Once | INTRAMUSCULAR | Status: AC
  Administered 2018-06-25: 50 ug via INTRAVENOUS
  Filled 2018-06-25: qty 2

## 2018-06-25 MED ORDER — CLINDAMYCIN HCL 300 MG PO CAPS: 300.0000 mg | ORAL_CAPSULE | Freq: Three times a day (TID) | ORAL | 0 refills | Status: DC

## 2018-06-25 MED ORDER — ONDANSETRON HCL 4 MG/2ML IJ SOLN
INTRAMUSCULAR | Status: AC
  Administered 2018-06-25: 4 mg via INTRAVENOUS
  Filled 2018-06-25: qty 2

## 2018-06-25 MED ORDER — CLINDAMYCIN PHOSPHATE 600 MG/50ML IV SOLN
600.0000 mg | Freq: Once | INTRAVENOUS | Status: AC
  Administered 2018-06-25: 600 mg via INTRAVENOUS
  Filled 2018-06-25: qty 50

## 2018-06-25 MED ORDER — DEXAMETHASONE SODIUM PHOSPHATE 10 MG/ML IJ SOLN
10.0000 mg | Freq: Once | INTRAMUSCULAR | Status: AC
  Administered 2018-06-25: 10 mg via INTRAVENOUS
  Filled 2018-06-25: qty 1

## 2018-06-25 NOTE — ED Provider Notes (Addendum)
[facility] Emergency Department Provider Note  ____________________________________________   I have reviewed the triage vital signs and the nursing notes. Where available I have reviewed prior notes and, if possible and indicated, outside hospital notes.    HISTORY  Chief Complaint Dental Pain    HPI Dalton PeppersRobert A Creeden is a 38 y.o. male  States that he has been having pain in his first molar on the bottom left for day or 2.  He states that it is very painful to eat, he states it hurts to open his jaws, he feels that there is some swelling going down his throat although is not having trouble breathing, he states it hurts to swallow, he states the pain is going "everywhere".  No fevers.  No nausea no vomiting.  Pain is sharp.  Patient is not unknown to this emergency department     Past Medical History:  Diagnosis Date  . History of chicken pox   . Hypertension     Patient Active Problem List   Diagnosis Date Noted  . Essential (primary) hypertension 12/01/2015  . Headache 12/01/2015  . Tobacco abuse 12/01/2015  . Insomnia 12/01/2015    Past Surgical History:  Procedure Laterality Date  . FRACTURE SURGERY  2012   reduction of closed fracture    Prior to Admission medications   Medication Sig Start Date End Date Taking? Authorizing Provider  albuterol (PROVENTIL HFA;VENTOLIN HFA) 108 (90 Base) MCG/ACT inhaler Inhale 2 puffs into the lungs every 6 (six) hours as needed for wheezing or shortness of breath. 08/01/17   Governor RooksLord, Rebecca, MD  brompheniramine-pseudoephedrine-DM 30-2-10 MG/5ML syrup Take 10 mLs by mouth 4 (four) times daily as needed. Patient not taking: Reported on 08/01/2017 05/26/17   Cuthriell, Delorise RoyalsJonathan D, PA-C  fluticasone [facility](FLONASE) 50 MCG/ACT nasal spray Place 1 spray into both nostrils 2 (two) times daily. Patient not taking: Reported on 08/01/2017 05/26/17   Cuthriell, Delorise RoyalsJonathan D, PA-C  hydrochlorothiazide (HYDRODIURIL) 25 MG tablet Take  1 tablet (25 mg total) by mouth daily. Reported on 12/09/2015 Patient not taking: Reported on 08/01/2017 11/09/16   Anola Gurneyhauvin, Colvin, PA  metFORMIN (GLUCOPHAGE) 500 MG tablet Take 1 tablet (500 mg total) by mouth 2 (two) times daily with a meal. 08/26/17 08/26/18  Minna AntisPaduchowski, Kevin, MD    Allergies Patient has no known allergies.  Family History  Problem Relation Age of Onset  . Diabetes Mother        type 2    Social History Social History   Tobacco Use  . Smoking status: Current Every Day Smoker    Packs/day: 0.50    Types: Cigarettes  . Smokeless tobacco: Never Used  Substance Use Topics  . Alcohol use: Yes    Alcohol/week: 0.0 standard drinks    Comment: occasional use  . Drug use: No    Review of Systems Constitutional: No fever/chills Eyes: No visual changes. ENT: No sore throat. No stiff neck no neck pain Cardiovascular: Denies chest pain. Respiratory: Denies shortness of breath. Gastrointestinal:   no vomiting.  No diarrhea.  No constipation. Genitourinary: Negative for dysuria. Musculoskeletal: Negative lower extremity swelling Skin: Negative for rash. Neurological: Negative for severe headaches, focal weakness or numbness.   ____________________________________________   PHYSICAL EXAM:  VITAL SIGNS: ED Triage Vitals [06/25/18 1550]  Enc Vitals Group     BP (!) 173/95     Pulse Rate 91     Resp 16     Temp 99.7 F (37.6 C)  Temp Source Oral     SpO2 100 %     Weight 264 lb (119.7 kg)     Height 5\' 7"  (1.702 m)     Head Circumference      Peak Flow      Pain Score 10     Pain Loc      Pain Edu?      Excl. in GC?     Constitutional: Alert and oriented. Well appearing and in no acute distress. Eyes: Conjunctivae are normal Head: Atraumatic HEENT: No congestion/rhinnorhea. Mucous membranes are moist.  Oropharynx non-erythematous, there is clearly an difficult looks decayed left first molar on the bottom, I do not see any real evidence of Ludwig's  angina, he states he is tender when I touch another but there is no brawny swelling or edema, he speaks in a normal voice, he seems to be able to swallow, his posterior pharynx is somewhat limited in visualization by body habitus but I do not see any acute edema there, he has no hoarse voice, Neck:   Nontender with no meningismus, no masses, no stridor Cardiovascular: Normal rate, regular rhythm. Grossly normal heart sounds.  Good peripheral circulation. Respiratory: Normal respiratory effort.  No retractions. Lungs CTAB. Abdominal: Soft and nontender. No distention. No guarding no rebound Back:  There is no focal tenderness or step off.  there is no midline tenderness there are no lesions noted. there is no CVA tenderness Musculoskeletal: No lower extremity tenderness, no upper extremity tenderness. No joint effusions, no DVT signs strong distal pulses no edema Neurologic:  Normal speech and language. No gross focal neurologic deficits are appreciated.  Skin:  Skin is warm, dry and intact. No rash noted. Psychiatric: Mood and affect are normal. Speech and behavior are normal.  ____________________________________________   LABS (all labs ordered are listed, but only abnormal results are displayed)  Labs Reviewed  CBC WITH DIFFERENTIAL/PLATELET - Abnormal; Notable for the following components:      Result Value   WBC 13.4 (*)    RBC 5.85 (*)    MCH 25.6 (*)    Neutro Abs 10.4 (*)    All other components within normal limits  COMPREHENSIVE METABOLIC PANEL - Abnormal; Notable for the following components:   Glucose, Bld 115 (*)    Creatinine, Ser 1.25 (*)    Calcium 8.7 (*)    All other components within normal limits    Pertinent labs  results that were available during my care of the patient were reviewed by me and considered in my medical decision making (see chart for details). ____________________________________________  EKG  I personally interpreted any EKGs ordered by me or  triage  ____________________________________________  RADIOLOGY  Pertinent labs & imaging results that were available during my care of the patient were reviewed by me and considered in my medical decision making (see chart for details). If possible, patient and/or family made aware of any abnormal findings.  No results found. ____________________________________________    PROCEDURES  Procedure(s) performed: None  Procedures  Critical Care performed: None  ____________________________________________   INITIAL IMPRESSION / ASSESSMENT AND PLAN / ED COURSE  Pertinent labs & imaging results that were available during my care of the patient were reviewed by me and considered in my medical decision making (see chart for details).  Patient here with dental pain, this is most likely just dental pain on exam however, the patient was initially complaining of difficulty swallowing and some sensation that there was a swelling  going on near his throat.  No evidence of allergic reaction.  This is predicated by dental pain.  He is morbidly obese and it is difficult to get a very good exam on him, I do not see any evidence of acute Ludewig's angina I do not see any evidence of any impending airway issue but given his complaint I did give him steroids, and IV antibiotics and pain medication we will send him for CT scan to rule those entities out.  Patient, after fentanyl, is pain-free, or at least feeling almost 100% better he states, and all of his other symptoms seem to have abated with the fentanyl which I take to be a good prognostic indicator, if CT scan is negative we will send him home on clindamycin and pain meds  ----------------------------------------- 7:08 PM on 06/25/2018 ----------------------------------------- Patient in no acute distress resting comfortably in the bed CT scan, as hoped and suspected, shows no evidence of Ludwig's angina or abscess or any significant inflammation.   Patient has no evidence of airway involvement or abscess or phlegmon.  We will send him home on antibiotics and pain medication, return precaution follow-up given and understood.     ____________________________________________   FINAL CLINICAL IMPRESSION(S) / ED DIAGNOSES  Final diagnoses:  None      This chart was dictated using voice recognition software.  Despite best efforts to proofread,  errors can occur which can change meaning.      Jeanmarie Plant, MD 06/25/18 Izola Price    Jeanmarie Plant, MD 06/25/18 Windell Moment

## 2018-06-25 NOTE — Discharge Instructions (Addendum)
The antibiotics and the pain medication do not drive on pain medication do not drive home after the pain medication you received here, return to the emergency room for swelling, fever, trouble breathing, or if you feel worse in any way  OPTIONS FOR DENTAL FOLLOW UP CARE  [place] [facility] - Local Safety Net Dental Clinics [email or web address]   [facility] [phone])  Sharl Ma [phone])  [place] [phone])  [facility] [phone])  [facility] [phone]) This clinic caters to the indigent population and is on a lottery system. Location: [employer] [facility], [employer], [address], [place] Clinic Hours: Wednesdays from 6pm - 9pm, patients seen by a lottery system. For dates, call or go to [email or web address] Services: Cleanings, fillings and simple extractions. Payment Options: DENTAL WORK IS FREE OF CHARGE. Bring proof of income or support. Best way to get seen: Arrive at 5:15 pm - this is a lottery, NOT first come/first serve, so arriving earlier will not increase your chances of being seen.     [facility] [phone] Select option 1 for emergencies   Location: [facility], [place], [address], [place] Clinic Hours: No walk-ins accepted - call the day before to schedule an appointment. Check in times are 9:30 am and 1:30 pm. Services: Simple extractions, temporary fillings, pulpectomy/pulp debridement, uncomplicated abscess drainage. Payment Options: PAYMENT IS DUE AT THE TIME OF SERVICE.  Fee is usually $100-200, additional surgical procedures (e.g. abscess drainage) may be extra. Cash, checks, Visa/MasterCard accepted.  Can file Medicaid if patient is covered for dental - patient should call case worker to check. No  discount for [facility] patients. Best way to get seen: MUST call the day before and get onto the schedule. Can usually be seen the next 1-2 days. No walk-ins accepted.     [facility] [phone]   Location: [facility], [address] [place] Clinic Hours: M, W, Th, F 8am or 1:30pm, Tues 9a or 1:30 - first come/first served. Services: Simple extractions, temporary fillings, uncomplicated abscess drainage.  You do not need to be an [facility] resident. Payment Options: PAYMENT IS DUE AT THE TIME OF SERVICE. Dental insurance, otherwise sliding scale - bring proof of income or support. Depending on income and treatment needed, cost is usually $50-200. Best way to get seen: Arrive early as it is first come/first served.     [facility] [phone]   Location: [address] Clinic Hours: Mon-Thu 8a-5p Services: Most basic dental services including extractions and fillings. Payment Options: PAYMENT IS DUE AT THE TIME OF SERVICE. Sliding scale, up to 50% off - bring proof if income or support. Medicaid with dental option accepted. Best way to get seen: Call to schedule an appointment, can usually be seen within 2 weeks OR they will try to see walk-ins - show up at 8a or 2p (you may have to wait).     [facility] [phone] [place] RESIDENTS ONLY   Location: [facility], [address], [place], [place] [address] Clinic Hours: By appointment only. Monday - Thursday 8am-5pm, Friday 8am-12pm Services: Cleanings, fillings, extractions. Payment Options: PAYMENT IS DUE AT THE TIME OF SERVICE. Cash, Visa or MasterCard. Sliding scale - $30 minimum per service. Best way to get seen: Come in to office, complete packet and make an appointment -  need proof of income or support monies for each household member and proof of [facility]  residence. Usually takes about a month to get in.     [facility] [phone]   Location: [address], [facility] Clinic Hours: Walk-in Urgent Care Dental Services are offered Monday-Friday mornings only. The numbers of emergencies accepted daily is limited to the number of providers available. Maximum 15 - Mondays, Wednesdays & Thursdays Maximum 10 - Tuesdays & Fridays Services: You do not need to be a [facility] resident to be seen for a dental emergency. Emergencies are defined as pain, swelling, abnormal bleeding, or dental trauma. Walkins will receive x-rays if needed. NOTE: Dental cleaning is not an emergency. Payment Options: PAYMENT IS DUE AT THE TIME OF SERVICE. Minimum co-pay is $40.00 for uninsured patients. Minimum co-pay is $3.00 for Medicaid with dental coverage. Dental Insurance is accepted and must be presented at time of visit. Medicare does not cover dental. Forms of payment: Cash, credit card, checks. Best way to get seen: If not previously registered with the clinic, walk-in dental registration begins at 7:15 am and is on a first come/first serve basis. If previously registered with the clinic, call to make an appointment.     [facility] [phone] [place] RESIDENTS ONLY   Location: [address] [place], [place] Clinic Hours: Mon-Thu 10a-2p Services: Extractions only! Payment Options: FREE (donations accepted) - bring proof of income or support Best way to get seen: Call and schedule an appointment OR come at 8am on the 1st Monday of every month (except for holidays) when it is first come/first served.     [facility] [phone]   Location: [address] [place], [place] Clinic Hours: Friday mornings Services, Payment Options, Best way to get seen: Call for info

## 2018-06-25 NOTE — ED Triage Notes (Signed)
Pt reporting dental pain x 2 days on the left side. Pt reporting 4 bad teeth that he can not be seen for until Feb. 2nd. Pt reporting feeling as though he is having oral swelling with difficulty swallowing his saliva. No fevers at home but temp of 99.7 in triage. Tenderness in left side of jaw and into neck with swelling noted upon assessment.

## 2019-06-14 IMAGING — CT CT CHEST W/O CM
2 of 4 series · 15 of 36 positions shown, 18 images · non-contrast
Comparison: None.

CLINICAL DATA: 36 y/o  M; hemoptysis and cough.

EXAM:
CT CHEST WITHOUT CONTRAST
TECHNIQUE: Multidetector CT imaging of the chest was performed following the
standard protocol without IV contrast.

[Series 2: thorax · axial · 0.66mm/px · z∈[-637,-415]mm · 12 of 131 slices shown, 15 images]
[im 10/131  mediastinal]
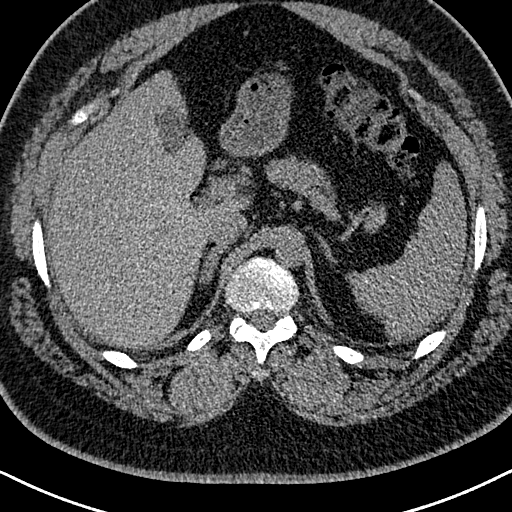
[im 10/131  lung]
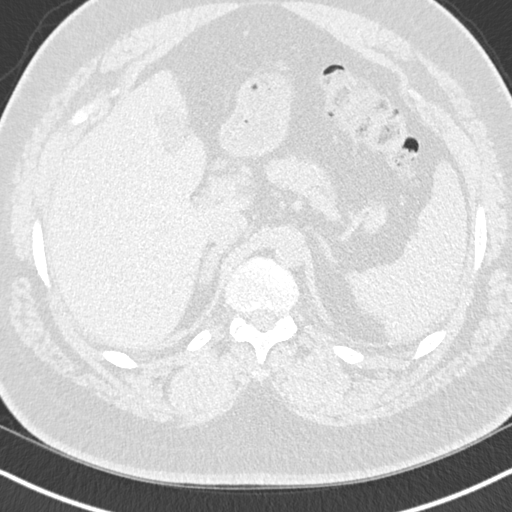
[im 19/131  lung]
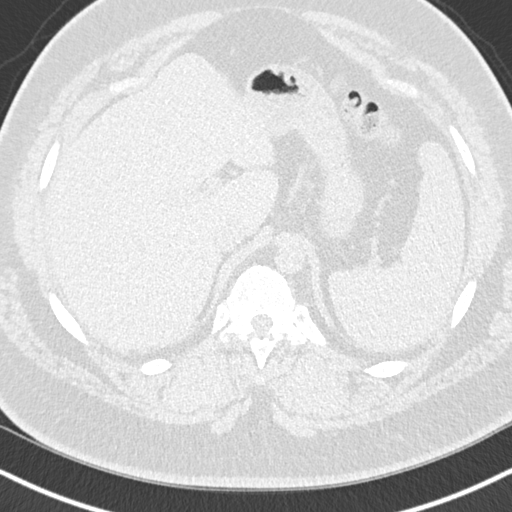
[im 28/131  lung]
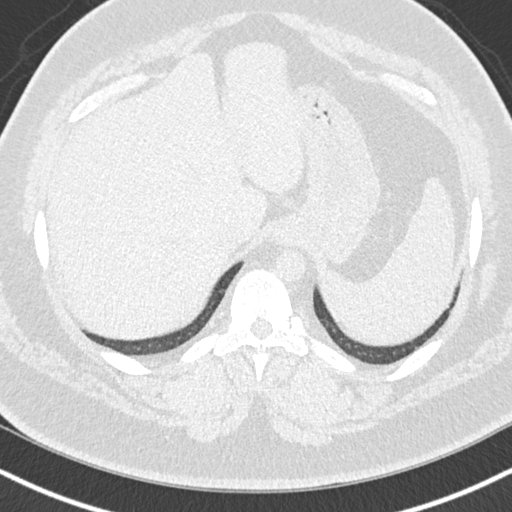
[im 38/131  lung]
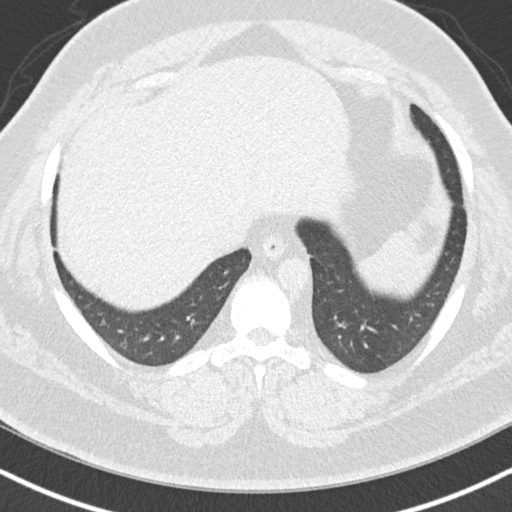
[im 47/131  mediastinal]
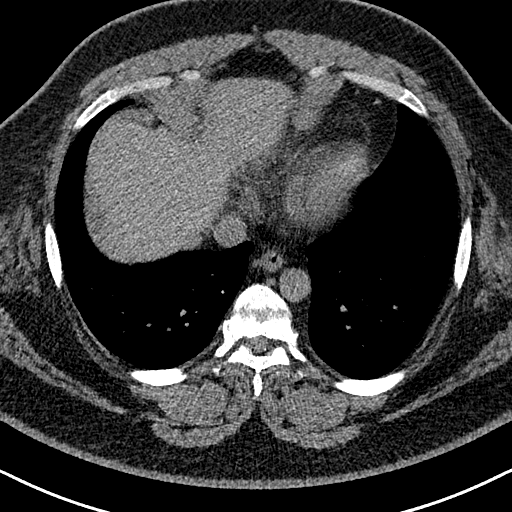
[im 47/131  lung]
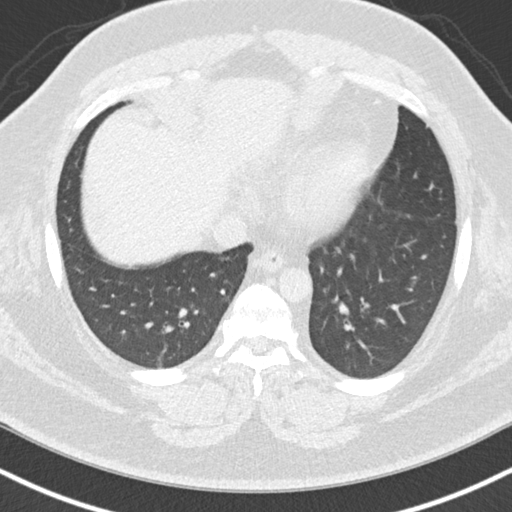
[im 56/131  lung]
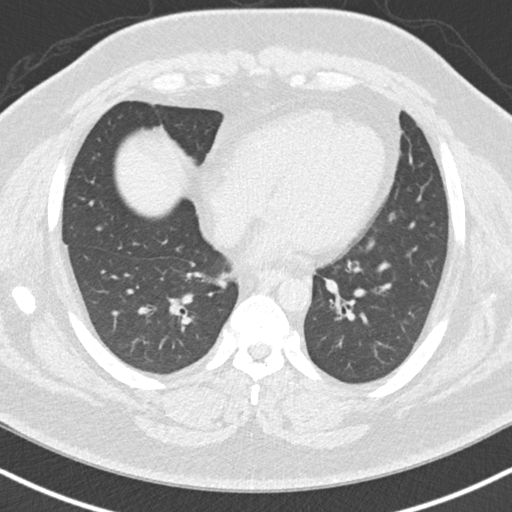
[im 75/131  lung]
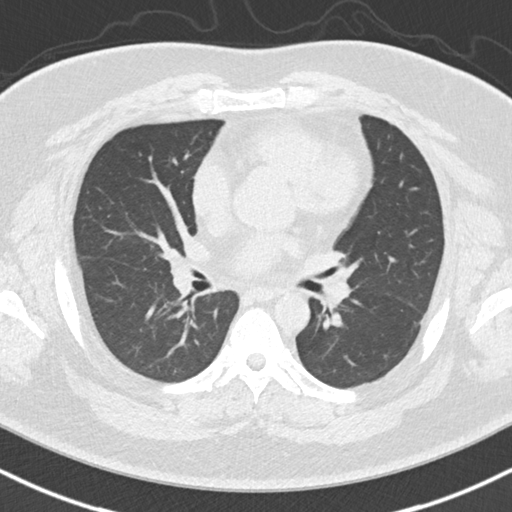
[im 84/131  lung]
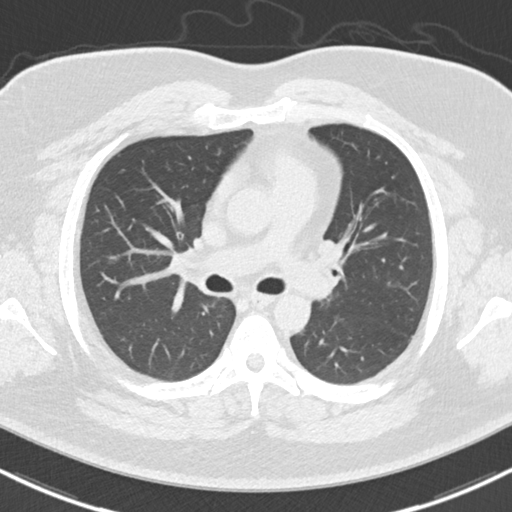
[im 93/131  mediastinal]
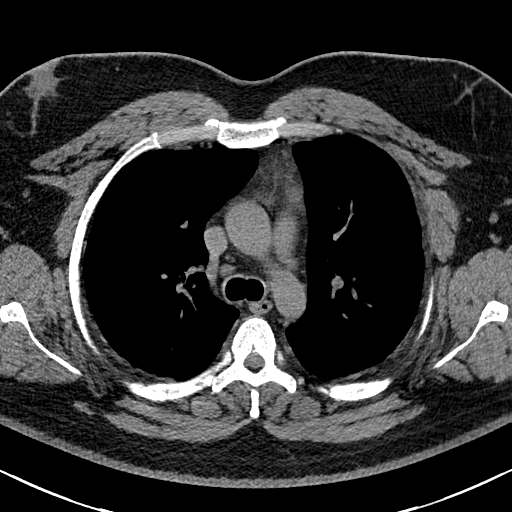
[im 93/131  lung]
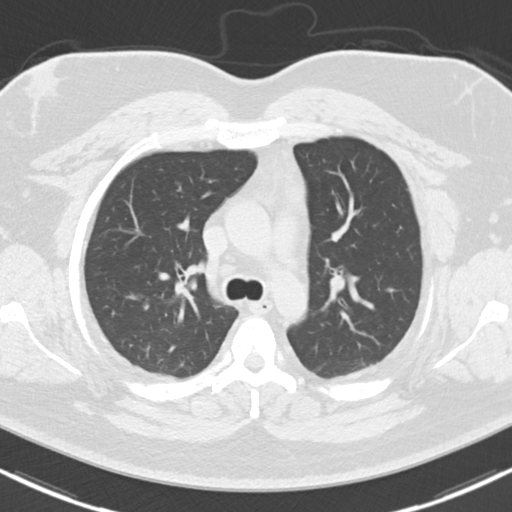
[im 103/131  lung]
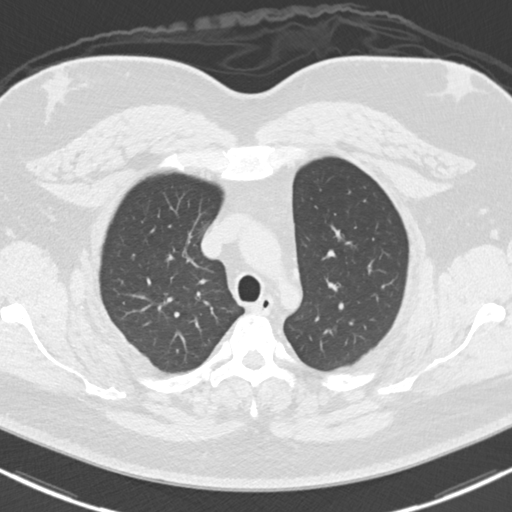
[im 112/131  lung]
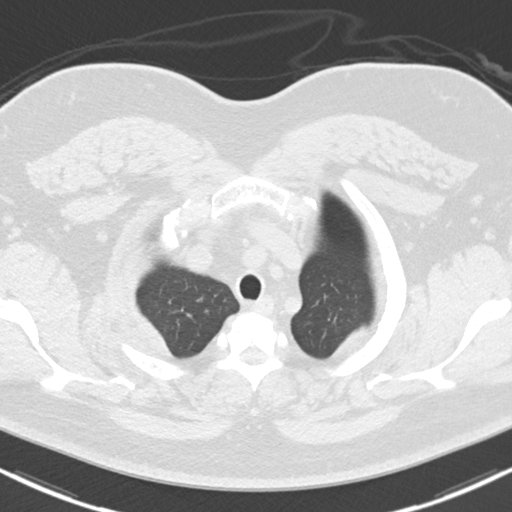
[im 121/131  lung]
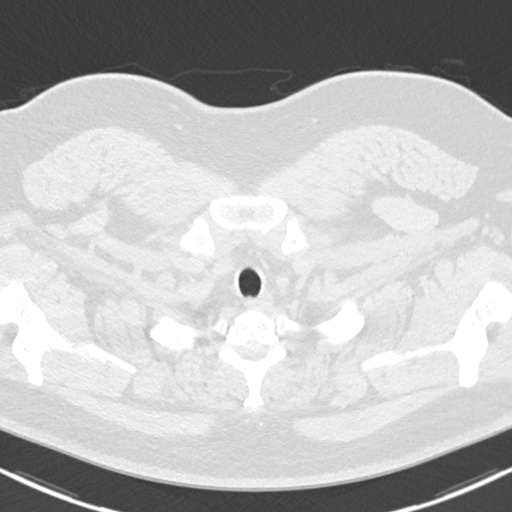

[Series 5: coronal · coronal · 0.53mm/px · 3 of 146 slices shown]
[im 30/146  lung]
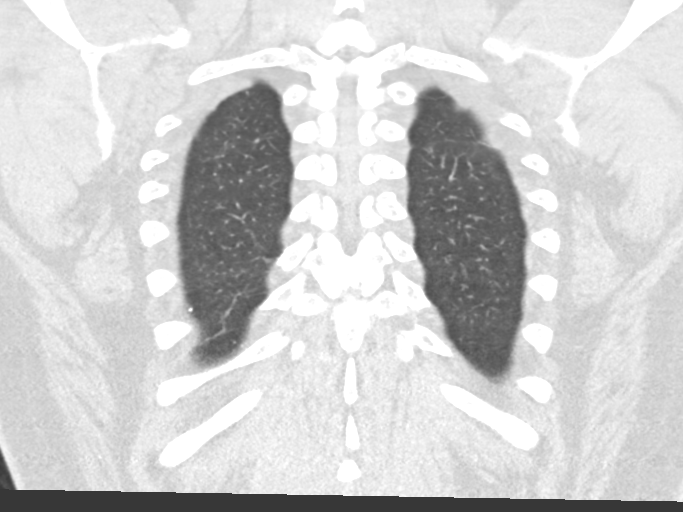
[im 59/146  lung]
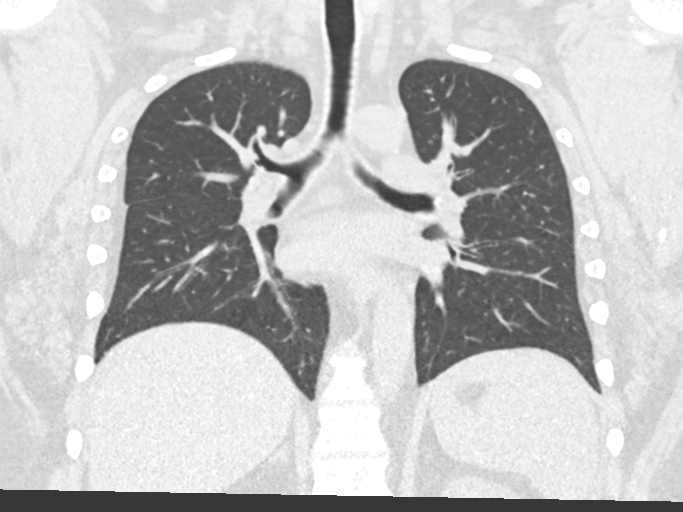
[im 88/146  lung]
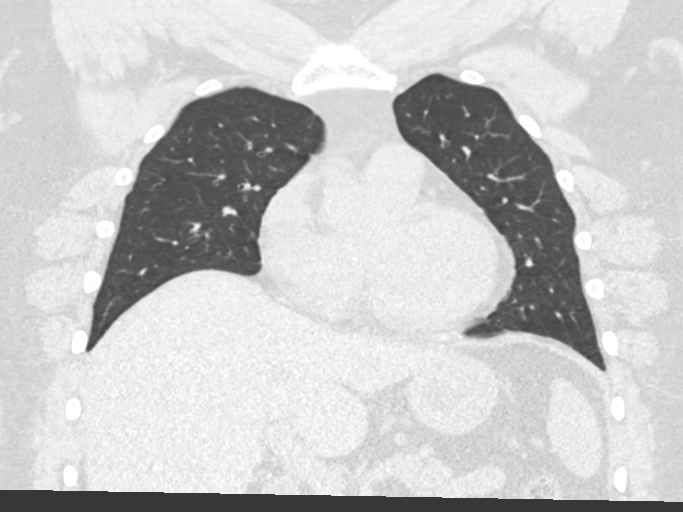

[15 of 36 positions shown; findings below may reference images not displayed]

FINDINGS: Cardiovascular: No significant vascular findings. Normal heart size.
No pericardial effusion.

Mediastinum/Nodes: No enlarged mediastinal or axillary lymph nodes.
Thyroid gland, trachea, and esophagus demonstrate no significant
findings.

Lungs/Pleura: Few punctate calcified granulomas in the lung bases.
Lungs are otherwise clear. No pleural effusion or pneumothorax.

Upper Abdomen: No acute abnormality.

Musculoskeletal: No chest wall mass or suspicious bone lesions
identified. Mild gynecomastia.
IMPRESSION: 1. No acute process identified.
2. Few punctate calcified granulomas in the lung bases.

By: Florence Val M.D.

## 2019-11-28 ENCOUNTER — Other Ambulatory Visit: Payer: Self-pay

## 2019-11-28 DIAGNOSIS — I10 Essential (primary) hypertension: Secondary | ICD-10-CM | POA: Insufficient documentation

## 2019-11-28 DIAGNOSIS — E1165 Type 2 diabetes mellitus with hyperglycemia: Secondary | ICD-10-CM | POA: Insufficient documentation

## 2019-11-28 DIAGNOSIS — R350 Frequency of micturition: Secondary | ICD-10-CM | POA: Diagnosis present

## 2019-11-28 DIAGNOSIS — F17210 Nicotine dependence, cigarettes, uncomplicated: Secondary | ICD-10-CM | POA: Insufficient documentation

## 2019-11-28 DIAGNOSIS — N179 Acute kidney failure, unspecified: Secondary | ICD-10-CM | POA: Insufficient documentation

## 2019-11-28 LAB — CBC
HCT: 46.3 % (ref 39.0–52.0)
Hemoglobin: 16.4 g/dL (ref 13.0–17.0)
MCH: 26.9 pg (ref 26.0–34.0)
MCHC: 35.4 g/dL (ref 30.0–36.0)
MCV: 76 fL — ABNORMAL LOW (ref 80.0–100.0)
Platelets: 196 10*3/uL (ref 150–400)
RBC: 6.09 MIL/uL — ABNORMAL HIGH (ref 4.22–5.81)
RDW: 12.5 % (ref 11.5–15.5)
WBC: 10.6 10*3/uL — ABNORMAL HIGH (ref 4.0–10.5)
nRBC: 0 % (ref 0.0–0.2)

## 2019-11-28 LAB — BASIC METABOLIC PANEL
Anion gap: 11 (ref 5–15)
BUN: 19 mg/dL (ref 6–20)
CO2: 24 mmol/L (ref 22–32)
Calcium: 9.5 mg/dL (ref 8.9–10.3)
Chloride: 101 mmol/L (ref 98–111)
Creatinine, Ser: 1.58 mg/dL — ABNORMAL HIGH (ref 0.61–1.24)
GFR calc Af Amer: >60 mL/min (ref >60)
GFR calc non Af Amer: 55 mL/min — ABNORMAL LOW (ref >60)
Glucose, Bld: 537 mg/dL (ref 70–99)
Potassium: 4.4 mmol/L (ref 3.5–5.1)
Sodium: 136 mmol/L (ref 135–145)

## 2019-11-28 MED ORDER — SODIUM CHLORIDE 0.9 % IV BOLUS
1000.0000 mL | Freq: Once | INTRAVENOUS | Status: AC
  Administered 2019-11-28: 1000 mL via INTRAVENOUS

## 2019-11-28 NOTE — ED Triage Notes (Signed)
Pt presents to the ED with c/o urinary frequency. Pt states "whenever I drink anything it just comes out of me (urinating)", pt also states that he is constantly thirsty.  Pt has a hx of DM, pt states he has not been taking his medication recently. Pt states his CBG at home was >400.

## 2019-11-29 ENCOUNTER — Emergency Department
Admission: EM | Admit: 2019-11-29 | Discharge: 2019-11-29 | Disposition: A | Discharge status: Home / Self Care | Payer: 59 | Attending: Emergency Medicine | Admitting: Emergency Medicine

## 2019-11-29 DIAGNOSIS — E119 Type 2 diabetes mellitus without complications: Secondary | ICD-10-CM

## 2019-11-29 DIAGNOSIS — N179 Acute kidney failure, unspecified: Secondary | ICD-10-CM

## 2019-11-29 DIAGNOSIS — R739 Hyperglycemia, unspecified: Secondary | ICD-10-CM

## 2019-11-29 HISTORY — DX: Type 2 diabetes mellitus without complications: E11.9

## 2019-11-29 LAB — HEPATIC FUNCTION PANEL
ALT: 38 U/L (ref 0–44)
AST: 26 U/L (ref 15–41)
Albumin: 3.8 g/dL (ref 3.5–5.0)
Alkaline Phosphatase: 112 U/L (ref 38–126)
Bilirubin, Direct: <0.1 mg/dL (ref 0.0–0.2)
Total Bilirubin: 1 mg/dL (ref 0.3–1.2)
Total Protein: 7.7 g/dL (ref 6.5–8.1)

## 2019-11-29 LAB — URINALYSIS, COMPLETE (UACMP) WITH MICROSCOPIC
Bacteria, UA: NONE SEEN
Bilirubin Urine: NEGATIVE
Glucose, UA: >=500 mg/dL — AB
Hgb urine dipstick: NEGATIVE
Ketones, ur: NEGATIVE mg/dL
Leukocytes,Ua: NEGATIVE
Nitrite: NEGATIVE
Protein, ur: 30 mg/dL — AB
Specific Gravity, Urine: 1.033 — ABNORMAL HIGH (ref 1.005–1.030)
pH: 5 (ref 5.0–8.0)

## 2019-11-29 LAB — GLUCOSE, CAPILLARY: Glucose-Capillary: 422 mg/dL — ABNORMAL HIGH (ref 70–99)

## 2019-11-29 MED ORDER — METFORMIN HCL 500 MG PO TABS
500.0000 mg | ORAL_TABLET | Freq: Once | ORAL | Status: AC
  Administered 2019-11-29: 500 mg via ORAL
  Filled 2019-11-29: qty 1

## 2019-11-29 MED ORDER — METFORMIN HCL 500 MG PO TABS: 500.0000 mg | ORAL_TABLET | Freq: Two times a day (BID) | ORAL | 0 refills | Status: DC

## 2019-11-29 NOTE — ED Provider Notes (Signed)
[facility] Emergency Department Provider Note   ____________________________________________   First MD Initiated Contact with Patient 11/29/19 0310     (approximate)  I have reviewed the triage vital signs and the nursing notes.   HISTORY  Chief Complaint Urinary Frequency and Hyperglycemia    HPI Dalton Foster is a 39 y.o. male who presents to the ED from home with a chief complaint of polyuria and polydipsia.  Patient has a history of type 2 diabetes who has not been taking his Metformin for close to 1 year.  States he is constantly thirsty and urinating frequently.  Recently has been trying to take charge of his diet and has scheduled an appointment with his PCP next week to refill his medications.  Denies fever, cough, chest pain, shortness of breath, abdominal pain, nausea or vomiting.      Past Medical History:  Diagnosis Date  . Diabetes mellitus without complication (HCC)   . History of chicken pox   . Hypertension     Patient Active Problem List   Diagnosis Date Noted  . Essential (primary) hypertension 12/01/2015  . Headache 12/01/2015  . Tobacco abuse 12/01/2015  . Insomnia 12/01/2015    Past Surgical History:  Procedure Laterality Date  . FRACTURE SURGERY  2012   reduction of closed fracture    Prior to Admission medications   Medication Sig Start Date End Date Taking? Authorizing Provider  albuterol (PROVENTIL HFA;VENTOLIN HFA) 108 (90 Base) MCG/ACT inhaler Inhale 2 puffs into the lungs every 6 (six) hours as needed for wheezing or shortness of breath. 08/01/17   Governor Rooks, MD  brompheniramine-pseudoephedrine-DM 30-2-10 MG/5ML syrup Take 10 mLs by mouth 4 (four) times daily as needed. Patient not taking: Reported on 08/01/2017 05/26/17   Cuthriell, Delorise Royals, PA-C  fluticasone [facility]) 50 MCG/ACT nasal spray Place 1 spray into both nostrils 2 (two) times daily. Patient not taking: Reported on 08/01/2017 05/26/17    Cuthriell, Delorise Royals, PA-C  hydrochlorothiazide (HYDRODIURIL) 25 MG tablet Take 1 tablet (25 mg total) by mouth daily. Reported on 12/09/2015 Patient not taking: Reported on 08/01/2017 11/09/16   Anola Gurney, PA  metFORMIN (GLUCOPHAGE) 500 MG tablet Take 1 tablet (500 mg total) by mouth 2 (two) times daily with a meal. 11/29/19 01/28/20  Irean Hong, MD  oxyCODONE-acetaminophen (PERCOCET) 5-325 MG tablet Take 1 tablet by mouth every 4 (four) hours as needed for severe pain. 06/25/18   Jeanmarie Plant, MD    Allergies Patient has no known allergies.  Family History  Problem Relation Age of Onset  . Diabetes Mother        type 2    Social History Social History   Tobacco Use  . Smoking status: Current Every Day Smoker    Packs/day: 0.50    Types: Cigarettes  . Smokeless tobacco: Never Used  Vaping Use  . Vaping Use: Never used  Substance Use Topics  . Alcohol use: Yes    Alcohol/week: 0.0 standard drinks    Comment: "every now and then"   . Drug use: No    Review of Systems  Constitutional: No fever/chills Eyes: No visual changes. ENT: No sore throat. Cardiovascular: Denies chest pain. Respiratory: Denies shortness of breath. Gastrointestinal: No abdominal pain.  No nausea, no vomiting.  No diarrhea.  No constipation. Genitourinary: Negative for dysuria. Musculoskeletal: Negative for back pain. Skin: Negative for rash. Neurological: Negative for headaches, focal weakness or numbness. Endocrine: Positive for polydipsia and polyuria.  ____________________________________________   PHYSICAL EXAM:  VITAL SIGNS: ED Triage Vitals  Enc Vitals Group     BP 11/28/19 2252 (!) 154/108     Pulse Rate 11/28/19 2252 (!) 128     Resp 11/28/19 2252 18     Temp 11/28/19 2252 98.8 F (37.1 C)     Temp Source 11/28/19 2252 Oral     SpO2 11/28/19 2252 97 %     Weight 11/28/19 2253 265 lb (120.2 kg)     Height 11/28/19 2253 5\' 7"  (1.702 m)     Head Circumference --      Peak  Flow --      Pain Score 11/28/19 2252 0     Pain Loc --      Pain Edu? --      Excl. in GC? --     Constitutional: Alert and oriented. Well appearing and in no acute distress. Eyes: Conjunctivae are normal. PERRL. EOMI. Head: Atraumatic. Nose: No congestion/rhinnorhea. Mouth/Throat: Mucous membranes are moist.   Neck: No stridor.   Cardiovascular: Normal rate, regular rhythm. Grossly normal heart sounds.  Good peripheral circulation. Respiratory: Normal respiratory effort.  No retractions. Lungs CTAB. Gastrointestinal: Soft and nontender. No distention. No abdominal bruits. No CVA tenderness. Musculoskeletal: No lower extremity tenderness nor edema.  No joint effusions. Neurologic:  Normal speech and language. No gross focal neurologic deficits are appreciated. No gait instability. Skin:  Skin is warm, dry and intact. No rash noted. Psychiatric: Mood and affect are normal. Speech and behavior are normal.  ____________________________________________   LABS (all labs ordered are listed, but only abnormal results are displayed)  Labs Reviewed  BASIC METABOLIC PANEL - Abnormal; Notable for the following components:      Result Value   Glucose, Bld 537 (*)    Creatinine, Ser 1.58 (*)    GFR calc non Af Amer 55 (*)    All other components within normal limits  CBC - Abnormal; Notable for the following components:   WBC 10.6 (*)    RBC 6.09 (*)    MCV 76.0 (*)    All other components within normal limits  URINALYSIS, COMPLETE (UACMP) WITH MICROSCOPIC - Abnormal; Notable for the following components:   Color, Urine YELLOW (*)    APPearance CLEAR (*)    Specific Gravity, Urine 1.033 (*)    Glucose, UA >=500 (*)    Protein, ur 30 (*)    All other components within normal limits  GLUCOSE, CAPILLARY - Abnormal; Notable for the following components:   Glucose-Capillary 422 (*)    All other components within normal limits  HEPATIC FUNCTION PANEL  CBG MONITORING, ED  CBG  MONITORING, ED   ____________________________________________  EKG  None ____________________________________________  RADIOLOGY  ED MD interpretation: None  Official radiology report(s): No results found.  ____________________________________________   PROCEDURES  Procedure(s) performed (including Critical Care):  Procedures   ____________________________________________   INITIAL IMPRESSION / ASSESSMENT AND PLAN / ED COURSE  As part of my medical decision making, I reviewed the following data within the electronic MEDICAL RECORD NUMBER Nursing notes reviewed and incorporated, Labs reviewed, Old chart reviewed and Notes from prior ED visits     Dalton Foster was evaluated in Emergency Department on 11/29/2019 for the symptoms described in the history of present illness. He was evaluated in the context of the global COVID-19 pandemic, which necessitated consideration that the patient might be at risk for infection with the SARS-CoV-2 virus that causes COVID-19. Institutional  protocols and algorithms that pertain to the evaluation of patients at risk for COVID-19 are in a state of rapid change based on information released by regulatory bodies including the CDC and federal and state organizations. These policies and algorithms were followed during the patient's care in the ED.    39 year old male with type 2 diabetes noncompliant with Metformin presenting with polydipsia and polyuria.  Differential diagnosis includes but is not limited to hyperglycemia, DKA, UTI, etc.  Laboratory results demonstrate hyperglycemia without elevated anion gap, mild AKI.  Patient received 1 L IV fluids prior to being roomed.  Repeat sugar is trending down.  Will refill his Metformin and encouraged him to keep his appointment with his PCP next week.  Strict return precautions given.  Patient verbalizes understanding agrees with plan of care.       ____________________________________________   FINAL CLINICAL IMPRESSION(S) / ED DIAGNOSES  Final diagnoses:  Hyperglycemia  Type 2 diabetes mellitus without complication, without long-term current use of insulin (HCC)  AKI (acute kidney injury) [facility])     ED Discharge Orders         Ordered    metFORMIN (GLUCOPHAGE) 500 MG tablet  2 times daily with meals     Discontinue  Reprint     11/29/19 0315           Note:  This document was prepared using Dragon voice recognition software and may include unintentional dictation errors.   Paulette Blanch, MD 11/29/19 [phone]

## 2019-11-29 NOTE — Discharge Instructions (Signed)
1.  Take your Metformin twice daily every day. 2.  Return to the ER for worsening symptoms, persistent vomiting, difficulty breathing or other concerns.

## 2019-11-30 LAB — GLUCOSE, CAPILLARY: Glucose-Capillary: 497 mg/dL — ABNORMAL HIGH (ref 70–99)

## 2020-04-05 ENCOUNTER — Emergency Department
Admission: EM | Admit: 2020-04-05 | Discharge: 2020-04-06 | Disposition: A | Discharge status: Home / Self Care | Payer: 59 | Attending: Emergency Medicine | Admitting: Emergency Medicine

## 2020-04-05 ENCOUNTER — Encounter: Payer: Self-pay | Admitting: *Deleted

## 2020-04-05 ENCOUNTER — Other Ambulatory Visit: Payer: Self-pay

## 2020-04-05 ENCOUNTER — Emergency Department: Payer: 59

## 2020-04-05 DIAGNOSIS — Z7984 Long term (current) use of oral hypoglycemic drugs: Secondary | ICD-10-CM | POA: Diagnosis not present

## 2020-04-05 DIAGNOSIS — I10 Essential (primary) hypertension: Secondary | ICD-10-CM | POA: Diagnosis not present

## 2020-04-05 DIAGNOSIS — R631 Polydipsia: Secondary | ICD-10-CM | POA: Insufficient documentation

## 2020-04-05 DIAGNOSIS — E1165 Type 2 diabetes mellitus with hyperglycemia: Secondary | ICD-10-CM | POA: Insufficient documentation

## 2020-04-05 DIAGNOSIS — Z794 Long term (current) use of insulin: Secondary | ICD-10-CM | POA: Diagnosis not present

## 2020-04-05 DIAGNOSIS — F17210 Nicotine dependence, cigarettes, uncomplicated: Secondary | ICD-10-CM | POA: Insufficient documentation

## 2020-04-05 DIAGNOSIS — R3589 Other polyuria: Secondary | ICD-10-CM | POA: Insufficient documentation

## 2020-04-05 DIAGNOSIS — Z20822 Contact with and (suspected) exposure to covid-19: Secondary | ICD-10-CM | POA: Diagnosis not present

## 2020-04-05 DIAGNOSIS — Z79899 Other long term (current) drug therapy: Secondary | ICD-10-CM | POA: Diagnosis not present

## 2020-04-05 LAB — CBC
HCT: 45.8 % (ref 39.0–52.0)
Hemoglobin: 15.6 g/dL (ref 13.0–17.0)
MCH: 27.3 pg (ref 26.0–34.0)
MCHC: 34.1 g/dL (ref 30.0–36.0)
MCV: 80.1 fL (ref 80.0–100.0)
Platelets: 161 10*3/uL (ref 150–400)
RBC: 5.72 MIL/uL (ref 4.22–5.81)
RDW: 12.4 % (ref 11.5–15.5)
WBC: 10 10*3/uL (ref 4.0–10.5)
nRBC: 0 % (ref 0.0–0.2)

## 2020-04-05 LAB — URINALYSIS, COMPLETE (UACMP) WITH MICROSCOPIC
Bilirubin Urine: NEGATIVE
Glucose, UA: >=500 mg/dL — AB
Hgb urine dipstick: NEGATIVE
Ketones, ur: NEGATIVE mg/dL
Leukocytes,Ua: NEGATIVE
Nitrite: NEGATIVE
Protein, ur: NEGATIVE mg/dL
Specific Gravity, Urine: 1.03 (ref 1.005–1.030)
pH: 5 (ref 5.0–8.0)

## 2020-04-05 LAB — OSMOLALITY: Osmolality: 316 mOsm/kg — ABNORMAL HIGH (ref 275–295)

## 2020-04-05 LAB — BASIC METABOLIC PANEL
Anion gap: 15 (ref 5–15)
BUN: 14 mg/dL (ref 6–20)
CO2: 25 mmol/L (ref 22–32)
Calcium: 9.1 mg/dL (ref 8.9–10.3)
Chloride: 91 mmol/L — ABNORMAL LOW (ref 98–111)
Creatinine, Ser: 1.49 mg/dL — ABNORMAL HIGH (ref 0.61–1.24)
GFR, Estimated: >60 mL/min (ref >60)
Glucose, Bld: 796 mg/dL (ref 70–99)
Potassium: 4.2 mmol/L (ref 3.5–5.1)
Sodium: 131 mmol/L — ABNORMAL LOW (ref 135–145)

## 2020-04-05 LAB — MAGNESIUM: Magnesium: 2.1 mg/dL (ref 1.7–2.4)

## 2020-04-05 LAB — CBG MONITORING, ED
Glucose-Capillary: 257 mg/dL — ABNORMAL HIGH (ref 70–99)
Glucose-Capillary: 304 mg/dL — ABNORMAL HIGH (ref 70–99)
Glucose-Capillary: 437 mg/dL — ABNORMAL HIGH (ref 70–99)
Glucose-Capillary: 484 mg/dL — ABNORMAL HIGH (ref 70–99)
Glucose-Capillary: 503 mg/dL (ref 70–99)

## 2020-04-05 LAB — RESPIRATORY PANEL BY RT PCR (FLU A&B, COVID)
Influenza A by PCR: NEGATIVE
Influenza B by PCR: NEGATIVE
SARS Coronavirus 2 by RT PCR: NEGATIVE

## 2020-04-05 LAB — BETA-HYDROXYBUTYRIC ACID: Beta-Hydroxybutyric Acid: 0.42 mmol/L — ABNORMAL HIGH (ref 0.05–0.27)

## 2020-04-05 MED ORDER — INSULIN ASPART 100 UNIT/ML ~~LOC~~ SOLN
10.0000 [IU] | Freq: Once | SUBCUTANEOUS | Status: AC
  Administered 2020-04-05: 10 [IU] via INTRAVENOUS
  Filled 2020-04-05: qty 1

## 2020-04-05 MED ORDER — METFORMIN HCL 500 MG PO TABS
1000.0000 mg | ORAL_TABLET | Freq: Once | ORAL | Status: AC
  Administered 2020-04-05: 1000 mg via ORAL
  Filled 2020-04-05: qty 2

## 2020-04-05 MED ORDER — ONDANSETRON 4 MG PO TBDP: 4.0000 mg | ORAL_TABLET | Freq: Three times a day (TID) | ORAL | 0 refills | Status: DC | PRN

## 2020-04-05 MED ORDER — METFORMIN HCL 1000 MG PO TABS: 1000.0000 mg | ORAL_TABLET | Freq: Two times a day (BID) | ORAL | 1 refills | Status: DC

## 2020-04-05 MED ORDER — SODIUM CHLORIDE 0.9 % IV BOLUS
2000.0000 mL | Freq: Once | INTRAVENOUS | Status: AC
  Administered 2020-04-05: 2000 mL via INTRAVENOUS

## 2020-04-05 NOTE — ED Provider Notes (Signed)
[facility] Emergency Department Provider Note  ____________________________________________  Time seen: Approximately 11:21 PM  I have reviewed the triage vital signs and the nursing notes.   HISTORY  Chief Complaint Hyperglycemia    HPI Dalton PeppersRobert A Bunnell is a 39 y.o. male with a history of hypertension and diabetes who comes ED due to high blood sugar.  He reports polyuria and polydipsia.  Also has some fatigue.  He went to urgent care where he had a negative rapid Covid test, was found to have a blood sugar greater than 600 and sent to the ED.  Symptoms are constant over the past several days, in the setting of missing multiple doses of his Metformin 1000 twice daily that he normally would take.  No alleviating factors.  Denies pain.      Past Medical History:  Diagnosis Date  . Diabetes mellitus without complication (HCC)   . History of chicken pox   . Hypertension      Patient Active Problem List   Diagnosis Date Noted  . Essential (primary) hypertension 12/01/2015  . Headache 12/01/2015  . Tobacco abuse 12/01/2015  . Insomnia 12/01/2015     Past Surgical History:  Procedure Laterality Date  . FRACTURE SURGERY  2012   reduction of closed fracture     Prior to Admission medications   Medication Sig Start Date End Date Taking? Authorizing Provider  albuterol (PROVENTIL HFA;VENTOLIN HFA) 108 (90 Base) MCG/ACT inhaler Inhale 2 puffs into the lungs every 6 (six) hours as needed for wheezing or shortness of breath. 08/01/17   Governor RooksLord, Rebecca, MD  brompheniramine-pseudoephedrine-DM 30-2-10 MG/5ML syrup Take 10 mLs by mouth 4 (four) times daily as needed. Patient not taking: Reported on 08/01/2017 05/26/17   Cuthriell, Delorise RoyalsJonathan D, PA-C  fluticasone [facility](FLONASE) 50 MCG/ACT nasal spray Place 1 spray into both nostrils 2 (two) times daily. Patient not taking: Reported on 08/01/2017 05/26/17   Cuthriell, Delorise RoyalsJonathan D, PA-C  hydrochlorothiazide (HYDRODIURIL) 25  MG tablet Take 1 tablet (25 mg total) by mouth daily. Reported on 12/09/2015 Patient not taking: Reported on 08/01/2017 11/09/16   Anola Gurneyhauvin, Ryelan, PA  metFORMIN (GLUCOPHAGE) 1000 MG tablet Take 1 tablet (1,000 mg total) by mouth 2 (two) times daily with a meal. 04/05/20 06/04/20  Sharman CheekStafford, Tangala Wiegert, MD  ondansetron (ZOFRAN ODT) 4 MG disintegrating tablet Take 1 tablet (4 mg total) by mouth every 8 (eight) hours as needed for nausea or vomiting. 04/05/20   Sharman CheekStafford, Dak Szumski, MD  oxyCODONE-acetaminophen (PERCOCET) 5-325 MG tablet Take 1 tablet by mouth every 4 (four) hours as needed for severe pain. 06/25/18   Jeanmarie PlantMcShane, James A, MD     Allergies Patient has no known allergies.   Family History  Problem Relation Age of Onset  . Diabetes Mother        type 2    Social History Social History   Tobacco Use  . Smoking status: Current Every Day Smoker    Packs/day: 0.50    Types: Cigarettes  . Smokeless tobacco: Never Used  Vaping Use  . Vaping Use: Never used  Substance Use Topics  . Alcohol use: Yes    Alcohol/week: 0.0 standard drinks    Comment: "every now and then"   . Drug use: No    Review of Systems  Constitutional:   No fever or chills.  ENT:   No sore throat. No rhinorrhea. Cardiovascular:   No chest pain or syncope. Respiratory:   No dyspnea or cough. Gastrointestinal:   Negative for  abdominal pain, vomiting and diarrhea.  Musculoskeletal:   Negative for focal pain or swelling All other systems reviewed and are negative except as documented above in ROS and HPI.  ____________________________________________   PHYSICAL EXAM:  VITAL SIGNS: ED Triage Vitals  Enc Vitals Group     BP 04/05/20 1648 (!) 125/99     Pulse Rate 04/05/20 1648 (!) 102     Resp 04/05/20 1648 18     Temp 04/05/20 1648 98.4 F (36.9 C)     Temp Source 04/05/20 1648 Oral     SpO2 04/05/20 1648 100 %     Weight 04/05/20 1649 265 lb (120.2 kg)     Height 04/05/20 1649 5\' 7"  (1.702 m)     Head  Circumference --      Peak Flow --      Pain Score 04/05/20 1648 5     Pain Loc --      Pain Edu? --      Excl. in GC? --     Vital signs reviewed, nursing assessments reviewed.   Constitutional:   Alert and oriented. Non-toxic appearance. Eyes:   Conjunctivae are normal. EOMI. PERRL. ENT      Head:   Normocephalic and atraumatic.      Nose: Normal.      Mouth/Throat:   Dry mucous membranes.      Neck:   No meningismus. Full ROM. Hematological/Lymphatic/Immunilogical:   No cervical lymphadenopathy. Cardiovascular:   Tachycardia heart rate 100. Symmetric bilateral radial and DP pulses.  No murmurs. Cap refill less than 2 seconds. Respiratory:   Normal respiratory effort without tachypnea/retractions. Breath sounds are clear and equal bilaterally. No wheezes/rales/rhonchi. Gastrointestinal:   Soft and nontender. Non distended. There is no CVA tenderness.  No rebound, rigidity, or guarding.  Musculoskeletal:   Normal range of motion in all extremities. No joint effusions.  No lower extremity tenderness.  No edema. Neurologic:   Normal speech and language.  Motor grossly intact. No acute focal neurologic deficits are appreciated.  Skin:    Skin is warm, dry and intact. No rash noted.  No petechiae, purpura, or bullae.  ____________________________________________    LABS (pertinent positives/negatives) (all labs ordered are listed, but only abnormal results are displayed) Labs Reviewed  BASIC METABOLIC PANEL - Abnormal; Notable for the following components:      Result Value   Sodium 131 (*)    Chloride 91 (*)    Glucose, Bld 796 (*)    Creatinine, Ser 1.49 (*)    All other components within normal limits  URINALYSIS, COMPLETE (UACMP) WITH MICROSCOPIC - Abnormal; Notable for the following components:   Color, Urine STRAW (*)    APPearance CLEAR (*)    Glucose, UA >=500 (*)    Bacteria, UA RARE (*)    All other components within normal limits  BETA-HYDROXYBUTYRIC ACID -  Abnormal; Notable for the following components:   Beta-Hydroxybutyric Acid 0.42 (*)    All other components within normal limits  OSMOLALITY - Abnormal; Notable for the following components:   Osmolality 316 (*)    All other components within normal limits  CBG MONITORING, ED - Abnormal; Notable for the following components:   Glucose-Capillary 503 (*)    All other components within normal limits  CBG MONITORING, ED - Abnormal; Notable for the following components:   Glucose-Capillary 437 (*)    All other components within normal limits  CBG MONITORING, ED - Abnormal; Notable for the following components:  Glucose-Capillary 484 (*)    All other components within normal limits  RESPIRATORY PANEL BY RT PCR (FLU A&B, COVID)  CBC  MAGNESIUM  BASIC METABOLIC PANEL   ____________________________________________   EKG    ____________________________________________    RADIOLOGY  DG Chest Portable 1 View  Result Date: 04/05/2020 CLINICAL DATA:  Cough, fatigue EXAM: PORTABLE CHEST 1 VIEW COMPARISON:  07/24/2017 FINDINGS: Heart and mediastinal contours are within normal limits. No focal opacities or effusions. No acute bony abnormality. IMPRESSION: No active disease. Electronically Signed   By: Charlett Nose M.D.   On: 04/05/2020 20:49    ____________________________________________   PROCEDURES .Critical Care Performed by: Sharman Cheek, MD Authorized by: Sharman Cheek, MD   Critical care provider statement:    Critical care time (minutes):  33   Critical care time was exclusive of:  Separately billable procedures and treating other patients   Critical care was necessary to treat or prevent imminent or life-threatening deterioration of the following conditions:  Metabolic crisis, endocrine crisis and dehydration   Critical care was time spent personally by me on the following activities:  Development of treatment plan with patient or surrogate, discussions with  consultants, evaluation of patient's response to treatment, examination of patient, obtaining history from patient or surrogate, ordering and performing treatments and interventions, ordering and review of laboratory studies, ordering and review of radiographic studies, pulse oximetry, re-evaluation of patient's condition and review of old charts    ____________________________________________  DIFFERENTIAL DIAGNOSIS   Hyperglycemia, DKA, electrolyte abnormality, dehydration, Covid  CLINICAL IMPRESSION / ASSESSMENT AND PLAN / ED COURSE  Medications ordered in the ED: Medications  sodium chloride 0.9 % bolus 2,000 mL (2,000 mLs Intravenous New Bag/Given 04/05/20 2047)  insulin aspart (novoLOG) injection 10 Units (10 Units Intravenous Given 04/05/20 2047)  metFORMIN (GLUCOPHAGE) tablet 1,000 mg (1,000 mg Oral Given 04/05/20 2238)  insulin aspart (novoLOG) injection 10 Units (10 Units Intravenous Given 04/05/20 2238)    Pertinent labs & imaging results that were available during my care of the patient were reviewed by me and considered in my medical decision making (see chart for details).  KIEFFER BLATZ was evaluated in Emergency Department on 04/05/2020 for the symptoms described in the history of present illness. He was evaluated in the context of the global COVID-19 pandemic, which necessitated consideration that the patient might be at risk for infection with the SARS-CoV-2 virus that causes COVID-19. Institutional protocols and algorithms that pertain to the evaluation of patients at risk for COVID-19 are in a state of rapid change based on information released by regulatory bodies including the CDC and federal and state organizations. These policies and algorithms were followed during the patient's care in the ED.   Patient presents with hyperglycemia, other symptoms most likely due to associated dehydration.  Lab panel is reassuring without evidence of DKA/acidosis.  Patient was given 1 L  of saline bolus and 10 units of IV insulin aspart which improved his blood sugar to about 450, so a second liter and 10 more units of IV insulin were given.  We will repeat the metabolic panel to ensure electrolytes are still acceptable.  Patient was given his 1000 mg dose of Metformin tonight.  I written him a new prescription.  If repeat metabolic panel is okay, he will be stable for discharge home.  Covid PCR was performed and is negative.      ____________________________________________   FINAL CLINICAL IMPRESSION(S) / ED DIAGNOSES    Final diagnoses:  Type 2 diabetes mellitus with hyperglycemia, without long-term current use of insulin (HCC)  Morbid obesity [facility])     ED Discharge Orders         Ordered    metFORMIN (GLUCOPHAGE) 1000 MG tablet  2 times daily with meals        04/05/20 2314    ondansetron (ZOFRAN ODT) 4 MG disintegrating tablet  Every 8 hours PRN        04/05/20 2314          Portions of this note were generated with dragon dictation software. Dictation errors may occur despite best attempts at proofreading.   Sharman Cheek, MD 04/05/20 2324

## 2020-04-05 NOTE — ED Triage Notes (Signed)
Pt ambulatory to triage.  Pt has high blood sugar today  Pt was sent from next care for eval.  Pt has not been taking metformin as prescribed.  Pt alert   Speech clear.

## 2020-04-05 NOTE — Discharge Instructions (Signed)
Your covid test was negative.  Resume taking metformin 1000mg  twice a day and follow up with your doctor within the next week.

## 2020-04-06 LAB — BASIC METABOLIC PANEL
Anion gap: 11 (ref 5–15)
BUN: 13 mg/dL (ref 6–20)
CO2: 27 mmol/L (ref 22–32)
Calcium: 8.1 mg/dL — ABNORMAL LOW (ref 8.9–10.3)
Chloride: 102 mmol/L (ref 98–111)
Creatinine, Ser: 1.37 mg/dL — ABNORMAL HIGH (ref 0.61–1.24)
GFR, Estimated: >60 mL/min (ref >60)
Glucose, Bld: 283 mg/dL — ABNORMAL HIGH (ref 70–99)
Potassium: 3.6 mmol/L (ref 3.5–5.1)
Sodium: 140 mmol/L (ref 135–145)

## 2020-04-06 NOTE — ED Provider Notes (Signed)
-----------------------------------------   12:24 AM on 04/06/2020 -----------------------------------------  Repeat met b unremarkable.  Patient resting without complaints.  Encouraged patient to resume taking his Metformin.  Strict return precautions given.  Patient verbalizes understanding agrees with plan of care.   Paulette Blanch, MD 04/06/20 [phone]

## 2021-02-02 ENCOUNTER — Emergency Department
Admission: EM | Admit: 2021-02-02 | Discharge: 2021-02-02 | Disposition: A | Discharge status: Home / Self Care | Payer: 59 | Attending: Emergency Medicine | Admitting: Emergency Medicine

## 2021-02-02 ENCOUNTER — Other Ambulatory Visit: Payer: Self-pay

## 2021-02-02 DIAGNOSIS — Z7984 Long term (current) use of oral hypoglycemic drugs: Secondary | ICD-10-CM | POA: Diagnosis not present

## 2021-02-02 DIAGNOSIS — F17210 Nicotine dependence, cigarettes, uncomplicated: Secondary | ICD-10-CM | POA: Diagnosis not present

## 2021-02-02 DIAGNOSIS — R739 Hyperglycemia, unspecified: Secondary | ICD-10-CM

## 2021-02-02 DIAGNOSIS — Z20822 Contact with and (suspected) exposure to covid-19: Secondary | ICD-10-CM | POA: Insufficient documentation

## 2021-02-02 DIAGNOSIS — E1165 Type 2 diabetes mellitus with hyperglycemia: Secondary | ICD-10-CM | POA: Insufficient documentation

## 2021-02-02 DIAGNOSIS — I10 Essential (primary) hypertension: Secondary | ICD-10-CM | POA: Insufficient documentation

## 2021-02-02 DIAGNOSIS — Z79899 Other long term (current) drug therapy: Secondary | ICD-10-CM | POA: Diagnosis not present

## 2021-02-02 LAB — URINALYSIS, COMPLETE (UACMP) WITH MICROSCOPIC
Bacteria, UA: NONE SEEN
Bilirubin Urine: NEGATIVE
Glucose, UA: >=500 mg/dL — AB
Hgb urine dipstick: NEGATIVE
Ketones, ur: 20 mg/dL — AB
Leukocytes,Ua: NEGATIVE
Nitrite: NEGATIVE
Protein, ur: 100 mg/dL — AB
Specific Gravity, Urine: 1.032 — ABNORMAL HIGH (ref 1.005–1.030)
WBC, UA: NONE SEEN WBC/hpf (ref 0–5)
pH: 5 (ref 5.0–8.0)

## 2021-02-02 LAB — CBG MONITORING, ED
Glucose-Capillary: 393 mg/dL — ABNORMAL HIGH (ref 70–99)
Glucose-Capillary: 277 mg/dL — ABNORMAL HIGH (ref 70–99)
Glucose-Capillary: 198 mg/dL — ABNORMAL HIGH (ref 70–99)

## 2021-02-02 LAB — CBC
HCT: 42.7 % (ref 39.0–52.0)
Hemoglobin: 15.2 g/dL (ref 13.0–17.0)
MCH: 28 pg (ref 26.0–34.0)
MCHC: 35.6 g/dL (ref 30.0–36.0)
MCV: 78.8 fL — ABNORMAL LOW (ref 80.0–100.0)
Platelets: 160 10*3/uL (ref 150–400)
RBC: 5.42 MIL/uL (ref 4.22–5.81)
RDW: 12.6 % (ref 11.5–15.5)
WBC: 8 10*3/uL (ref 4.0–10.5)
nRBC: 0 % (ref 0.0–0.2)

## 2021-02-02 LAB — RESP PANEL BY RT-PCR (FLU A&B, COVID) ARPGX2
Influenza A by PCR: NEGATIVE
Influenza B by PCR: NEGATIVE
SARS Coronavirus 2 by RT PCR: NEGATIVE

## 2021-02-02 LAB — BASIC METABOLIC PANEL
Anion gap: 12 (ref 5–15)
BUN: 24 mg/dL — ABNORMAL HIGH (ref 6–20)
CO2: 23 mmol/L (ref 22–32)
Calcium: 9 mg/dL (ref 8.9–10.3)
Chloride: 99 mmol/L (ref 98–111)
Creatinine, Ser: 1.86 mg/dL — ABNORMAL HIGH (ref 0.61–1.24)
GFR, Estimated: 47 mL/min — ABNORMAL LOW (ref >60)
Glucose, Bld: 389 mg/dL — ABNORMAL HIGH (ref 70–99)
Potassium: 4 mmol/L (ref 3.5–5.1)
Sodium: 134 mmol/L — ABNORMAL LOW (ref 135–145)

## 2021-02-02 MED ORDER — INSULIN ASPART 100 UNIT/ML IJ SOLN
10.0000 [IU] | Freq: Once | INTRAMUSCULAR | Status: AC
  Administered 2021-02-02: 10 [IU] via INTRAVENOUS
  Filled 2021-02-02: qty 1

## 2021-02-02 MED ORDER — SODIUM CHLORIDE 0.9 % IV BOLUS
1000.0000 mL | Freq: Once | INTRAVENOUS | Status: AC
  Administered 2021-02-02: 1000 mL via INTRAVENOUS

## 2021-02-02 NOTE — ED Provider Notes (Signed)
[facility] Emergency Department Provider Note  Time seen: 10:33 AM  I have reviewed the triage vital signs and the nursing notes.   HISTORY  Chief Complaint Hyperglycemia   HPI Dalton Foster is a 40 y.o. male with a past medical history of diabetes, hypertension, presents to the emergency department for elevated blood glucose.  According to the patient over the past week or so he has noticed that his blood sugars have been elevated as high as 500 at times.  Patient takes metformin, states he is post to be taking a weekly injection as well but has not been doing so.  Patient denies any recent illnesses such as fever cough congestion shortness of breath denies any abdominal pain vomiting does state occasional loose stool.  Has noticed increased urine frequency but denies dysuria.   Past Medical History:  Diagnosis Date   Diabetes mellitus without complication (HCC)    History of chicken pox    Hypertension     Patient Active Problem List   Diagnosis Date Noted   Essential (primary) hypertension 12/01/2015   Headache 12/01/2015   Tobacco abuse 12/01/2015   Insomnia 12/01/2015    Past Surgical History:  Procedure Laterality Date   FRACTURE SURGERY  2012   reduction of closed fracture    Prior to Admission medications   Medication Sig Start Date End Date Taking? Authorizing Provider  albuterol (PROVENTIL HFA;VENTOLIN HFA) 108 (90 Base) MCG/ACT inhaler Inhale 2 puffs into the lungs every 6 (six) hours as needed for wheezing or shortness of breath. 08/01/17   Governor Rooks, MD  brompheniramine-pseudoephedrine-DM 30-2-10 MG/5ML syrup Take 10 mLs by mouth 4 (four) times daily as needed. Patient not taking: Reported on 08/01/2017 05/26/17   Cuthriell, Delorise Royals, PA-C  fluticasone [facility]) 50 MCG/ACT nasal spray Place 1 spray into both nostrils 2 (two) times daily. Patient not taking: Reported on 08/01/2017 05/26/17   Cuthriell, Delorise Royals, PA-C   hydrochlorothiazide (HYDRODIURIL) 25 MG tablet Take 1 tablet (25 mg total) by mouth daily. Reported on 12/09/2015 Patient not taking: Reported on 08/01/2017 11/09/16   Anola Gurney, PA  metFORMIN (GLUCOPHAGE) 1000 MG tablet Take 1 tablet (1,000 mg total) by mouth 2 (two) times daily with a meal. 04/05/20 06/04/20  Sharman Cheek, MD  ondansetron (ZOFRAN ODT) 4 MG disintegrating tablet Take 1 tablet (4 mg total) by mouth every 8 (eight) hours as needed for nausea or vomiting. 04/05/20   Sharman Cheek, MD  oxyCODONE-acetaminophen (PERCOCET) 5-325 MG tablet Take 1 tablet by mouth every 4 (four) hours as needed for severe pain. 06/25/18   Jeanmarie Plant, MD    No Known Allergies  Family History  Problem Relation Age of Onset   Diabetes Mother        type 2    Social History Social History   Tobacco Use   Smoking status: Every Day    Packs/day: 0.50    Types: Cigarettes   Smokeless tobacco: Never  Vaping Use   Vaping Use: Never used  Substance Use Topics   Alcohol use: Yes    Alcohol/week: 0.0 standard drinks    Comment: "every now and then"    Drug use: No    Review of Systems Constitutional: Negative for fever. ENT: Negative for recent illness/congestion Cardiovascular: Negative for chest pain. Respiratory: Negative for shortness of breath.  Negative for cough. Gastrointestinal: Negative for abdominal pain, vomiting.  Intermittent diarrhea. Genitourinary: Urinary frequency without dysuria. Musculoskeletal: Negative for musculoskeletal complaints Neurological: Negative  for headache All other ROS negative  ____________________________________________   PHYSICAL EXAM:  VITAL SIGNS: ED Triage Vitals [02/02/21 0949]  Enc Vitals Group     BP 137/90     Pulse Rate 96     Resp 18     Temp 98 F (36.7 C)     Temp Source Oral     SpO2 97 %     Weight      Height      Head Circumference      Peak Flow      Pain Score      Pain Loc      Pain Edu?      Excl. in  GC?     Constitutional: Alert and oriented. Well appearing and in no distress. Eyes: Normal exam ENT      Head: Normocephalic and atraumatic.      Mouth/Throat: Mucous membranes are moist. Cardiovascular: Normal rate, regular rhythm.  Respiratory: Normal respiratory effort without tachypnea nor retractions. Breath sounds are clear Gastrointestinal: Soft and nontender. No distention.  Musculoskeletal: Nontender with normal range of motion in all extremities Neurologic:  Normal speech and language. No gross focal neurologic deficits  Skin:  Skin is warm, dry and intact.  Psychiatric: Mood and affect are normal.   ____________________________________________   INITIAL IMPRESSION / ASSESSMENT AND PLAN / ED COURSE  Pertinent labs & imaging results that were available during my care of the patient were reviewed by me and considered in my medical decision making (see chart for details).   Patient presents emergency department for high blood glucose at home greater than 500.  Patient states he has been approximately 1 week of intermittent high blood sugars.  Patient denies any recent changes to his diet does state he is post to be on a medication for diabetes via injection once a week that he has not been doing.  Patient has been taking his metformin.  Blood glucose is elevated 393.  Remainder of the labs are largely nonrevealing, potassium of 4.0.  Chronic kidney disease although somewhat worse today.  We will IV hydrate we will treat with IV insulin.  Patient agreeable to plan of care.  Sugars improved currently less than 200.  Patient will be discharged.  Discussed with patient the importance of sticking to his diabetic diet as well as medication regimen.  Patient agreeable to plan of care.  [name] was evaluated in Emergency Department on 02/02/2021 for the symptoms described in the history of present illness. He was evaluated in the context of the global COVID-19 pandemic, which  necessitated consideration that the patient might be at risk for infection with the SARS-CoV-2 virus that causes COVID-19. Institutional protocols and algorithms that pertain to the evaluation of patients at risk for COVID-19 are in a state of rapid change based on information released by regulatory bodies including the CDC and federal and state organizations. These policies and algorithms were followed during the patient's care in the ED.  ____________________________________________   FINAL CLINICAL IMPRESSION(S) / ED DIAGNOSES  Hyperglycemia   Minna Antis, MD 02/02/21 1435

## 2021-02-02 NOTE — ED Notes (Signed)
Pt given cup of water 

## 2021-02-02 NOTE — ED Notes (Signed)
cbg 393

## 2021-02-02 NOTE — ED Triage Notes (Signed)
Pt states he has been having issues with his sugar lately, states it was over 500 yesterday, states he took his metformin last night but it was till over 300. Pt is ambulatory with a steady gait.

## 2022-01-15 ENCOUNTER — Emergency Department
Admission: EM | Admit: 2022-01-15 | Discharge: 2022-01-15 | Disposition: A | Discharge status: Home / Self Care | Payer: 59 | Attending: Emergency Medicine | Admitting: Emergency Medicine

## 2022-01-15 ENCOUNTER — Other Ambulatory Visit: Payer: Self-pay

## 2022-01-15 DIAGNOSIS — J069 Acute upper respiratory infection, unspecified: Secondary | ICD-10-CM

## 2022-01-15 DIAGNOSIS — E119 Type 2 diabetes mellitus without complications: Secondary | ICD-10-CM | POA: Insufficient documentation

## 2022-01-15 DIAGNOSIS — I10 Essential (primary) hypertension: Secondary | ICD-10-CM | POA: Insufficient documentation

## 2022-01-15 DIAGNOSIS — Z20822 Contact with and (suspected) exposure to covid-19: Secondary | ICD-10-CM | POA: Insufficient documentation

## 2022-01-15 LAB — RESP PANEL BY RT-PCR (FLU A&B, COVID) ARPGX2
Influenza A by PCR: NEGATIVE
Influenza B by PCR: NEGATIVE
SARS Coronavirus 2 by RT PCR: NEGATIVE

## 2022-01-15 NOTE — ED Provider Notes (Signed)
   [facility] Provider Note    Event Date/Time   First MD Initiated Contact with Patient 01/15/22 1203     (approximate)   History   Cough (Congestion , cough , no other complaints  )   HPI  Dalton Foster is a 41 y.o. male history of hypertension and diabetes presents emergency department complaining of cough, congestion, body aches since Thursday or Friday.  Patient states he is not coughing up anything yellow-green.  States he thinks he might have a upper respiratory infection.  No known exposure to COVID.  Patient is vaccinated for COVID      Physical Exam   Triage Vital Signs: ED Triage Vitals [01/15/22 1103]  Enc Vitals Group     BP (!) 156/105     Pulse Rate 78     Resp 18     Temp 98.9 F (37.2 C)     Temp Source Oral     SpO2 98 %     Weight 242 lb 8.1 oz (110 kg)     Height 5\' 7"  (1.702 m)     Head Circumference      Peak Flow      Pain Score 2     Pain Loc      Pain Edu?      Excl. in GC?     Most recent vital signs: Vitals:   01/15/22 1103  BP: (!) 156/105  Pulse: 78  Resp: 18  Temp: 98.9 F (37.2 C)  SpO2: 98%     General: Awake, no distress.   CV:  Good peripheral perfusion. regular rate and  rhythm Resp:  Normal effort. Lungs CTA Abd:  No distention.   Other:      ED Results / Procedures / Treatments   Labs (all labs ordered are listed, but only abnormal results are displayed) Labs Reviewed  RESP PANEL BY RT-PCR (FLU A&B, COVID) [id number]     EKG     RADIOLOGY     PROCEDURES:   Procedures   MEDICATIONS ORDERED IN ED: Medications - No data to display   IMPRESSION / MDM / ASSESSMENT AND PLAN / ED COURSE  I reviewed the triage vital signs and the nursing notes.                              Differential diagnosis includes, but is not limited to, acute URI, PNA, COVID, influenza  Patient's presentation is most consistent with acute complicated illness / injury requiring diagnostic workup.    COVID/influenza test ordered   COVID/influenza test is negative.  Did explain findings to the patient.  He is instructed to use over-the-counter medications.  Follow-up with his regular doctor if not improving in 3 days.  Return if worsening.  He was discharged stable condition.   FINAL CLINICAL IMPRESSION(S) / ED DIAGNOSES   Final diagnoses:  Acute URI     Rx / DC Orders   ED Discharge Orders     None        Note:  This document was prepared using Dragon voice recognition software and may include unintentional dictation errors.    01/17/22, PA-C 01/15/22 1322    01/17/22, MD 01/15/22 01/17/22

## 2022-01-15 NOTE — ED Triage Notes (Signed)
Congestion, cough , no other complaints started Friday

## 2022-01-15 NOTE — Discharge Instructions (Signed)
Follow up with your regular doctor if not improving in 3 days Take otc theraflu or mucinex for your symptoms This is viral and you do not need an antibiotic at this time

## 2022-01-15 NOTE — ED Notes (Signed)
See triage note. Pt ambulatory to room; steady; in NAD.

## 2022-02-10 ENCOUNTER — Encounter: Payer: Self-pay | Admitting: Emergency Medicine

## 2022-02-10 ENCOUNTER — Other Ambulatory Visit: Payer: Self-pay

## 2022-02-10 ENCOUNTER — Emergency Department
Admission: EM | Admit: 2022-02-10 | Discharge: 2022-02-10 | Disposition: A | Discharge status: Home / Self Care | Payer: Self-pay | Attending: Emergency Medicine | Admitting: Emergency Medicine

## 2022-02-10 DIAGNOSIS — H9202 Otalgia, left ear: Secondary | ICD-10-CM | POA: Insufficient documentation

## 2022-02-10 DIAGNOSIS — I889 Nonspecific lymphadenitis, unspecified: Secondary | ICD-10-CM | POA: Insufficient documentation

## 2022-02-10 LAB — BASIC METABOLIC PANEL
Anion gap: 8 (ref 5–15)
BUN: 17 mg/dL (ref 6–20)
CO2: 23 mmol/L (ref 22–32)
Calcium: 8.8 mg/dL — ABNORMAL LOW (ref 8.9–10.3)
Chloride: 107 mmol/L (ref 98–111)
Creatinine, Ser: 1.28 mg/dL — ABNORMAL HIGH (ref 0.61–1.24)
GFR, Estimated: >60 mL/min (ref >60)
Glucose, Bld: 145 mg/dL — ABNORMAL HIGH (ref 70–99)
Potassium: 3.9 mmol/L (ref 3.5–5.1)
Sodium: 138 mmol/L (ref 135–145)

## 2022-02-10 LAB — CBC
HCT: 45.4 % (ref 39.0–52.0)
Hemoglobin: 14.7 g/dL (ref 13.0–17.0)
MCH: 25.7 pg — ABNORMAL LOW (ref 26.0–34.0)
MCHC: 32.4 g/dL (ref 30.0–36.0)
MCV: 79.5 fL — ABNORMAL LOW (ref 80.0–100.0)
Platelets: 166 10*3/uL (ref 150–400)
RBC: 5.71 MIL/uL (ref 4.22–5.81)
RDW: 12.9 % (ref 11.5–15.5)
WBC: 7.6 10*3/uL (ref 4.0–10.5)
nRBC: 0 % (ref 0.0–0.2)

## 2022-02-10 MED ORDER — HYDROCODONE-ACETAMINOPHEN 5-325 MG PO TABS: 1.0000 | ORAL_TABLET | Freq: Four times a day (QID) | ORAL | 0 refills | Status: DC | PRN

## 2022-02-10 MED ORDER — CEPHALEXIN 500 MG PO CAPS
500.0000 mg | ORAL_CAPSULE | Freq: Once | ORAL | Status: AC
  Administered 2022-02-10: 500 mg via ORAL
  Filled 2022-02-10: qty 1

## 2022-02-10 MED ORDER — CEPHALEXIN 500 MG PO CAPS: 500.0000 mg | ORAL_CAPSULE | Freq: Four times a day (QID) | ORAL | 0 refills | Status: AC

## 2022-02-10 NOTE — ED Provider Triage Note (Signed)
Emergency Medicine Provider Triage Evaluation Note  Dalton Foster , a 41 y.o. male  was evaluated in triage.  Pt complains of left ear pain for 1 week.  No over-the-counter medications have been taken.  Patient has been using peroxide to flush his ear out.  He also reports that he has pain from his ear radiating down his neck to his left shoulder.  Review of Systems  Positive: Left ear pain. Negative: No drainage, fever, chills, URI symptoms.  Physical Exam  Ht 5\' 7"  (1.702 m)   Wt 120.2 kg   BMI 41.50 kg/m  Gen:   Awake, no distress   Resp:  Normal effort, lungs clear bilaterally. MSK:   Moves extremities without difficulty  Other:  Right EAC clear, TM is dull.  Left EAC clear, TM is dull but no erythema or injection is noted.  No tenderness on palpation preauricular area.  Medical Decision Making  Medically screening exam initiated at 7:23 PM.  Appropriate orders placed.  was informed that the remainder of the evaluation will be completed by another provider, this initial triage assessment does not replace that evaluation, and the importance of remaining in the ED until their evaluation is complete.     Maylon Peppers, PA-C 02/10/22 1926

## 2022-02-10 NOTE — Discharge Instructions (Addendum)
I have given you some Vicodin if you need it for pain.  There is only a small amounts he can use it before bed so he can sleep better.  After that use Tylenol or Advil as needed for pain.  You can take up to 3 of the over-the-counter Advil 3 times a day to help with the pain.  You can do that for a week without any trouble.  Please take the Keflex antibiotic 1 pill 4 times a day to help with the infection.  Please do not hesitate to return if you get worse or are not getting better after 3 to 4 days.  This includes increased pain or swelling or any fever or any other problems.  Please follow-up with [facility] charity care or one of the other dental and medical clinics that I gave you on those 2 lists.  I want somebody to keep an eye on your nose and the swollen glands in your neck and the other problems you are having from this infection.  Again please do not hesitate to return if you are having any worsening of any of the problems at all.

## 2022-02-10 NOTE — ED Provider Notes (Signed)
[facility] Provider Note    Event Date/Time   First MD Initiated Contact with Patient 02/10/22 1938     (approximate)   History   Otalgia   HPI  Dalton Foster is a 41 y.o. male who reports he had a sore on the side of his nose that he scratched and it popped and not infected.  He has some scabbing on the left side of his nose and some swelling with a couple half millimeters size whiteheads.  It is very small.  He says he has a headache on the left side of his head his left ear hurts and he has some swollen glands in the posterior part of his left neck which are tender.  This been going on for couple weeks and gradually getting worse.  He has not been running a fever.      Physical Exam   Triage Vital Signs: ED Triage Vitals [02/10/22 1919]  Enc Vitals Group     BP      Pulse      Resp      Temp      Temp src      SpO2      Weight 265 lb (120.2 kg)     Height 5\' 7"  (1.702 m)     Head Circumference      Peak Flow      Pain Score 7     Pain Loc      Pain Edu?      Excl. in GC?     Most recent vital signs: There were no vitals filed for this visit.  General: Awake, no distress.  Head normocephalic atraumatic except for nose as described in the scab on the left side of the nose and the left ala is about half a centimeter in diameter Ears: Left TM is clear there is some redness overlying the bones in the middle ear on the eardrum but otherwise the eardrum looks normal and the canal looks normal Eyes pupils equal round reactive extraocular movements intact Neck supple there are some shotty tender nodes in the left neck.  Palpation of these reproduces his neck pain. Mouth no erythema or exudate patient has very poor dentition CV:  Good peripheral perfusion.  Heart regular rate and rhythm no audible murmurs Resp:  Normal effort.  Lungs are clear   ED Results / Procedures / Treatments   Labs (all labs ordered are listed, but only abnormal  results are displayed) Labs Reviewed  CBC - Abnormal; Notable for the following components:      Result Value   MCV 79.5 (*)    MCH 25.7 (*)    All other components within normal limits  BASIC METABOLIC PANEL - Abnormal; Notable for the following components:   Glucose, Bld 145 (*)    Creatinine, Ser 1.28 (*)    Calcium 8.8 (*)    All other components within normal limits     EKG     RADIOLOGY    PROCEDURES:  Critical Care performed:   Procedures   MEDICATIONS ORDERED IN ED: Medications - No data to display   IMPRESSION / MDM / ASSESSMENT AND PLAN / ED COURSE  I reviewed the triage vital signs and the nursing notes. Patient does not have a white count.  He does appear to have some cervical lymphadenopathy and a small infection that is been going on for over a week in the left side of the nose.  I advised the patient not to pick at the scab as he has been doing.  We will give him some antibiotics just a little bit of pain medication and I have given him a list of dental offices.  I have included [facility] charity [facility].  They can follow-up with him for his teeth.  I will give him a list of follow-up offices for medical care to just keep an eye on the nose.  Right now there does not seem to be anything more than a superficial scab and a little bit of swelling around it.  It does not look as though there is any serious infection of anything except for the soft tissue of the skin not involving the cartilage.    Patient's presentation is most consistent with acute illness / injury with system symptoms.     FINAL CLINICAL IMPRESSION(S) / ED DIAGNOSES   Final diagnoses:  Left ear pain  Cervical adenitis  Also localized soft tissue infection of the nose   Rx / DC Orders   ED Discharge Orders     None        Note:  This document was prepared using Dragon voice recognition software and may include unintentional dictation errors.   Arnaldo Natal, MD 02/10/22 [phone]

## 2022-02-10 NOTE — ED Triage Notes (Signed)
Pt reports left ear pain for about a week reports pian radiates to left side of face, no fevers, has had a cough for a while

## 2022-06-14 ENCOUNTER — Emergency Department
Admission: EM | Admit: 2022-06-14 | Discharge: 2022-06-14 | Disposition: A | Discharge status: Home / Self Care | Payer: 59 | Attending: Emergency Medicine | Admitting: Emergency Medicine

## 2022-06-14 ENCOUNTER — Emergency Department: Payer: 59

## 2022-06-14 DIAGNOSIS — E119 Type 2 diabetes mellitus without complications: Secondary | ICD-10-CM | POA: Insufficient documentation

## 2022-06-14 DIAGNOSIS — S60922A Unspecified superficial injury of left hand, initial encounter: Secondary | ICD-10-CM | POA: Diagnosis present

## 2022-06-14 DIAGNOSIS — S60512A Abrasion of left hand, initial encounter: Secondary | ICD-10-CM | POA: Insufficient documentation

## 2022-06-14 DIAGNOSIS — I10 Essential (primary) hypertension: Secondary | ICD-10-CM | POA: Insufficient documentation

## 2022-06-14 DIAGNOSIS — Y92410 Unspecified street and highway as the place of occurrence of the external cause: Secondary | ICD-10-CM | POA: Diagnosis not present

## 2022-06-14 MED ORDER — TRAMADOL HCL 50 MG PO TABS: 50.0000 mg | ORAL_TABLET | Freq: Four times a day (QID) | ORAL | 0 refills | Status: DC | PRN

## 2022-06-14 MED ORDER — MORPHINE SULFATE (PF) 4 MG/ML IV SOLN
4.0000 mg | Freq: Once | INTRAVENOUS | Status: AC
  Administered 2022-06-14: 4 mg via INTRAMUSCULAR
  Filled 2022-06-14: qty 1
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
## 2022-06-14 NOTE — ED Notes (Signed)
Xray at the bedside.

## 2022-07-27 ENCOUNTER — Emergency Department
Admission: EM | Admit: 2022-07-27 | Discharge: 2022-07-27 | Disposition: A | Discharge status: Home / Self Care | Payer: PRIVATE HEALTH INSURANCE | Attending: Emergency Medicine | Admitting: Emergency Medicine

## 2022-07-27 DIAGNOSIS — E1165 Type 2 diabetes mellitus with hyperglycemia: Secondary | ICD-10-CM | POA: Diagnosis not present

## 2022-07-27 DIAGNOSIS — R739 Hyperglycemia, unspecified: Secondary | ICD-10-CM | POA: Diagnosis present

## 2022-07-27 DIAGNOSIS — R000 Tachycardia, unspecified: Secondary | ICD-10-CM | POA: Diagnosis not present

## 2022-07-27 LAB — CBG MONITORING, ED
Glucose-Capillary: 372 mg/dL — ABNORMAL HIGH (ref 70–99)
Glucose-Capillary: 229 mg/dL — ABNORMAL HIGH (ref 70–99)

## 2022-07-27 LAB — BASIC METABOLIC PANEL
Anion gap: 9 (ref 5–15)
BUN: 16 mg/dL (ref 6–20)
CO2: 22 mmol/L (ref 22–32)
Calcium: 8.8 mg/dL — ABNORMAL LOW (ref 8.9–10.3)
Chloride: 103 mmol/L (ref 98–111)
Creatinine, Ser: 1.19 mg/dL (ref 0.61–1.24)
GFR, Estimated: >60 mL/min (ref >60)
Glucose, Bld: 365 mg/dL — ABNORMAL HIGH (ref 70–99)
Potassium: 4 mmol/L (ref 3.5–5.1)
Sodium: 134 mmol/L — ABNORMAL LOW (ref 135–145)

## 2022-07-27 LAB — URINALYSIS, ROUTINE W REFLEX MICROSCOPIC
Bacteria, UA: NONE SEEN
Bilirubin Urine: NEGATIVE
Glucose, UA: >=500 mg/dL — AB
Hgb urine dipstick: NEGATIVE
Ketones, ur: NEGATIVE mg/dL
Leukocytes,Ua: NEGATIVE
Nitrite: NEGATIVE
Protein, ur: 100 mg/dL — AB
Specific Gravity, Urine: 1.023 (ref 1.005–1.030)
pH: 6 (ref 5.0–8.0)

## 2022-07-27 LAB — CBC
HCT: 44.4 % (ref 39.0–52.0)
Hemoglobin: 14.8 g/dL (ref 13.0–17.0)
MCH: 26.1 pg (ref 26.0–34.0)
MCHC: 33.3 g/dL (ref 30.0–36.0)
MCV: 78.4 fL — ABNORMAL LOW (ref 80.0–100.0)
Platelets: 166 10*3/uL (ref 150–400)
RBC: 5.66 MIL/uL (ref 4.22–5.81)
RDW: 12.4 % (ref 11.5–15.5)
WBC: 7.6 10*3/uL (ref 4.0–10.5)
nRBC: 0 % (ref 0.0–0.2)

## 2022-07-27 MED ORDER — SODIUM CHLORIDE 0.9 % IV BOLUS
1000.0000 mL | Freq: Once | INTRAVENOUS | Status: AC
  Administered 2022-07-27: 1000 mL via INTRAVENOUS

## 2022-07-27 MED ORDER — METFORMIN HCL 1000 MG PO TABS: 1000.0000 mg | ORAL_TABLET | Freq: Two times a day (BID) | ORAL | 1 refills | Status: AC

## 2022-07-27 NOTE — Discharge Instructions (Signed)
Please seek medical attention for any high fevers, chest pain, shortness of breath, change in behavior, persistent vomiting, bloody stool or any other new or concerning symptoms.  

## 2022-07-27 NOTE — ED Triage Notes (Signed)
Pt presents to the ED via POV due to hyperglycemia. Pt states he is a diabetic and cannot remember when the last time he took his insulin. Pt states "I got off track". Pt denies VD. Pt A&Ox4

## 2022-07-27 NOTE — ED Provider Notes (Signed)
[facility] Provider Note    Event Date/Time   First MD Initiated Contact with Patient 07/27/22 1711     (approximate)   History   Hyperglycemia   HPI  Dalton Foster is a 42 y.o. male presents to the emergency department today because of concerns for elevated blood sugar.  The patient states that he had been feeling dizzy and lightheaded today.  Thus what prompted him to check his blood sugar.  He does admit to not being very good with taking his medication.  He states he has not taken his diabetic medication for quite some time.  He says this is because he moved and he lost his medication.  He says that he is trying to talk to his primary care doctor about obtaining new prescriptions.  In addition the patient has noticed increased thirst and urination over the past couple of weeks. Denies any fevers.     Physical Exam   Triage Vital Signs: ED Triage Vitals  Enc Vitals Group     BP 07/27/22 1541 (!) 142/107     Pulse Rate 07/27/22 1541 (!) 107     Resp 07/27/22 1541 18     Temp 07/27/22 1541 97.7 F (36.5 C)     Temp Source 07/27/22 1541 Oral     SpO2 07/27/22 1541 97 %     Weight 07/27/22 1540 264 lb 15.9 oz (120.2 kg)     Height 07/27/22 1540 '5\' 7"'$  (1.702 m)     Head Circumference --      Peak Flow --      Pain Score 07/27/22 1540 0     Pain Loc --      Pain Edu? --      Excl. in [place]? --     Most recent vital signs: Vitals:   07/27/22 1541  BP: (!) 142/107  Pulse: (!) 107  Resp: 18  Temp: 97.7 F (36.5 C)  SpO2: 97%   General: Awake, alert, oriented. CV:  Good peripheral perfusion. Tachycardia. Resp:  Normal effort. Lungs clear. Abd:  No distention.    ED Results / Procedures / Treatments   Labs (all labs ordered are listed, but only abnormal results are displayed) Labs Reviewed  BASIC METABOLIC PANEL - Abnormal; Notable for the following components:      Result Value   Sodium 134 (*)    Glucose, Bld 365 (*)    Calcium 8.8  (*)    All other components within normal limits  CBC - Abnormal; Notable for the following components:   MCV 78.4 (*)    All other components within normal limits  URINALYSIS, ROUTINE W REFLEX MICROSCOPIC - Abnormal; Notable for the following components:   Color, Urine STRAW (*)    APPearance CLEAR (*)    Glucose, UA >=500 (*)    Protein, ur 100 (*)    All other components within normal limits  CBG MONITORING, ED - Abnormal; Notable for the following components:   Glucose-Capillary 372 (*)    All other components within normal limits  CBG MONITORING, ED  CBG MONITORING, ED     EKG  None   RADIOLOGY None   PROCEDURES:  Critical Care performed: No  Procedures   MEDICATIONS ORDERED IN ED: Medications - No data to display   IMPRESSION / MDM / [place] / ED COURSE  I reviewed the triage vital signs and the nursing notes.  Differential diagnosis includes, but is not limited to, infection, DKA.  Patient's presentation is most consistent with acute presentation with potential threat to life or bodily function.   Patient presented to the emergency department today because of concerns for elevated blood sugar.  Patient is not currently taking his blood pressure medications.  Blood sugar is elevated here.  However no anion gap or ketones in the urine to suggest DKA.  Patient was given IV fluids and his sugars did improve.  Plan on discharging with prescription for metformin.  Discussed importance of follow-up with primary care.   FINAL CLINICAL IMPRESSION(S) / ED DIAGNOSES   Final diagnoses:  Hyperglycemia    Note:  This document was prepared using Dragon voice recognition software and may include unintentional dictation errors.    Nance Pear, MD 07/27/22 2014

## 2022-08-07 ENCOUNTER — Other Ambulatory Visit: Payer: Self-pay

## 2022-08-07 ENCOUNTER — Emergency Department: Payer: 59

## 2022-08-07 ENCOUNTER — Emergency Department
Admission: EM | Admit: 2022-08-07 | Discharge: 2022-08-07 | Disposition: A | Discharge status: Home / Self Care | Payer: PRIVATE HEALTH INSURANCE | Attending: Emergency Medicine | Admitting: Emergency Medicine

## 2022-08-07 ENCOUNTER — Emergency Department
Admission: EM | Admit: 2022-08-07 | Discharge: 2022-08-07 | Disposition: A | Discharge status: Home / Self Care | Payer: 59 | Attending: Emergency Medicine | Admitting: Emergency Medicine

## 2022-08-07 ENCOUNTER — Emergency Department: Payer: PRIVATE HEALTH INSURANCE

## 2022-08-07 DIAGNOSIS — R739 Hyperglycemia, unspecified: Secondary | ICD-10-CM | POA: Insufficient documentation

## 2022-08-07 DIAGNOSIS — F17210 Nicotine dependence, cigarettes, uncomplicated: Secondary | ICD-10-CM | POA: Diagnosis not present

## 2022-08-07 DIAGNOSIS — S0081XA Abrasion of other part of head, initial encounter: Secondary | ICD-10-CM | POA: Insufficient documentation

## 2022-08-07 DIAGNOSIS — E1165 Type 2 diabetes mellitus with hyperglycemia: Secondary | ICD-10-CM | POA: Diagnosis not present

## 2022-08-07 DIAGNOSIS — Z20822 Contact with and (suspected) exposure to covid-19: Secondary | ICD-10-CM | POA: Insufficient documentation

## 2022-08-07 DIAGNOSIS — I10 Essential (primary) hypertension: Secondary | ICD-10-CM | POA: Insufficient documentation

## 2022-08-07 DIAGNOSIS — S0990XA Unspecified injury of head, initial encounter: Secondary | ICD-10-CM | POA: Diagnosis present

## 2022-08-07 DIAGNOSIS — Z5321 Procedure and treatment not carried out due to patient leaving prior to being seen by health care provider: Secondary | ICD-10-CM | POA: Insufficient documentation

## 2022-08-07 DIAGNOSIS — T148XXA Other injury of unspecified body region, initial encounter: Secondary | ICD-10-CM

## 2022-08-07 DIAGNOSIS — R55 Syncope and collapse: Secondary | ICD-10-CM | POA: Insufficient documentation

## 2022-08-07 DIAGNOSIS — Z7984 Long term (current) use of oral hypoglycemic drugs: Secondary | ICD-10-CM | POA: Insufficient documentation

## 2022-08-07 DIAGNOSIS — X58XXXA Exposure to other specified factors, initial encounter: Secondary | ICD-10-CM | POA: Insufficient documentation

## 2022-08-07 LAB — BASIC METABOLIC PANEL WITH GFR
Anion gap: 8 (ref 5–15)
BUN: 16 mg/dL (ref 6–20)
CO2: 25 mmol/L (ref 22–32)
Calcium: 8.7 mg/dL — ABNORMAL LOW (ref 8.9–10.3)
Chloride: 101 mmol/L (ref 98–111)
Creatinine, Ser: 1.24 mg/dL (ref 0.61–1.24)
GFR, Estimated: >60 mL/min
Glucose, Bld: 192 mg/dL — ABNORMAL HIGH (ref 70–99)
Potassium: 4 mmol/L (ref 3.5–5.1)
Sodium: 134 mmol/L — ABNORMAL LOW (ref 135–145)

## 2022-08-07 LAB — CBC
HCT: 42.5 % (ref 39.0–52.0)
Hemoglobin: 14 g/dL (ref 13.0–17.0)
MCH: 26.3 pg (ref 26.0–34.0)
MCHC: 32.9 g/dL (ref 30.0–36.0)
MCV: 79.9 fL — ABNORMAL LOW (ref 80.0–100.0)
Platelets: 216 10*3/uL (ref 150–400)
RBC: 5.32 MIL/uL (ref 4.22–5.81)
RDW: 12.3 % (ref 11.5–15.5)
WBC: 9 10*3/uL (ref 4.0–10.5)
nRBC: 0 % (ref 0.0–0.2)

## 2022-08-07 LAB — CBG MONITORING, ED: Glucose-Capillary: 170 mg/dL — ABNORMAL HIGH (ref 70–99)

## 2022-08-07 LAB — RESP PANEL BY RT-PCR (RSV, FLU A&B, COVID)  RVPGX2
Influenza A by PCR: NEGATIVE
Influenza B by PCR: NEGATIVE
Resp Syncytial Virus by PCR: NEGATIVE
SARS Coronavirus 2 by RT PCR: NEGATIVE

## 2022-08-07 MED ORDER — SODIUM CHLORIDE 0.9 % IV BOLUS
1000.0000 mL | Freq: Once | INTRAVENOUS | Status: AC
  Administered 2022-08-07: 1000 mL via INTRAVENOUS

## 2022-08-07 MED ORDER — ACETAMINOPHEN 500 MG PO TABS
1000.0000 mg | ORAL_TABLET | Freq: Once | ORAL | Status: AC
  Administered 2022-08-07: 1000 mg via ORAL
  Filled 2022-08-07: qty 2

## 2022-08-07 NOTE — ED Notes (Signed)
Patient states he has "a little bit of dizziness, a little bit of blurry vision" that just started. Patient is alert and  oriented at this time. Patient denies difficulty walking.

## 2022-08-07 NOTE — ED Notes (Signed)
Triage done on Dalton Foster as wrong name was put it originally. Registration states they are merging the chart.
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
## 2023-03-16 ENCOUNTER — Emergency Department
Admission: EM | Admit: 2023-03-16 | Discharge: 2023-03-16 | Disposition: A | Discharge status: Home / Self Care | Payer: BLUE CROSS/BLUE SHIELD | Attending: Emergency Medicine | Admitting: Emergency Medicine

## 2023-03-16 ENCOUNTER — Other Ambulatory Visit: Payer: Self-pay

## 2023-03-16 DIAGNOSIS — J069 Acute upper respiratory infection, unspecified: Secondary | ICD-10-CM | POA: Insufficient documentation

## 2023-03-16 DIAGNOSIS — Z20822 Contact with and (suspected) exposure to covid-19: Secondary | ICD-10-CM | POA: Diagnosis not present

## 2023-03-16 DIAGNOSIS — R059 Cough, unspecified: Secondary | ICD-10-CM | POA: Diagnosis present

## 2023-03-16 LAB — RESP PANEL BY RT-PCR (RSV, FLU A&B, COVID)  RVPGX2
Influenza A by PCR: NEGATIVE
Influenza B by PCR: NEGATIVE
Resp Syncytial Virus by PCR: NEGATIVE
SARS Coronavirus 2 by RT PCR: NEGATIVE

## 2023-03-16 MED ORDER — BENZONATATE 200 MG PO CAPS: 200.0000 mg | ORAL_CAPSULE | Freq: Three times a day (TID) | ORAL | 0 refills | Status: DC | PRN

## 2023-03-16 NOTE — Discharge Instructions (Addendum)
Follow-up with your primary care if any continued problems or concerns.  Also your blood pressure was elevated in the emergency department should be rechecked at your primary care.  Continue with your regular medications.  A prescription for cough medication was sent to the pharmacy for you to take every 8 hours as needed for cough.

## 2023-03-16 NOTE — ED Provider Notes (Signed)
[facility] Provider Note    None    (approximate)   History   URI   HPI  Dalton Foster is a 42 y.o. male   presents to the ED with complaint of upper respiratory symptoms for approximately 8 days.  He is unaware of any fever and denies chills.  Patient does have a cough that is not controlled with over-the-counter medication.  1 episode of diarrhea.  Family member also is in the room coughing.  Patient has a history of hypertension and diabetes.      Physical Exam   Triage Vital Signs: ED Triage Vitals [03/16/23 1039]  Encounter Vitals Group     BP (!) 142/102     Systolic BP Percentile      Diastolic BP Percentile      Pulse Rate 93     Resp 17     Temp 98 F (36.7 C)     Temp Source Oral     SpO2 98 %     Weight 260 lb (117.9 kg)     Height 5\' 7"  (1.702 m)     Head Circumference      Peak Flow      Pain Score 3     Pain Loc      Pain Education      Exclude from Growth Chart     Most recent vital signs: Vitals:   03/16/23 1039 03/16/23 1159  BP: (!) 142/102 (!) 131/101  Pulse: 93   Resp: 17 17  Temp: 98 F (36.7 C)   SpO2: 98%      General: Awake, no distress.  Alert, talkative. CV:  Good peripheral perfusion.  Heart regular rate and rhythm. Resp:  Normal effort.  Clear bilaterally. Abd:  No distention.  Other:  Nasal congestion present.  Posterior pharynx without erythema, tonsillar exudate and uvula is midline.  Neck is supple without cervical lymphadenopathy.   ED Results / Procedures / Treatments   Labs (all labs ordered are listed, but only abnormal results are displayed) Labs Reviewed  RESP PANEL BY RT-PCR (RSV, FLU A&B, COVID)  [id number]      PROCEDURES:  Critical Care performed:   Procedures   MEDICATIONS ORDERED IN ED: Medications - No data to display   IMPRESSION / MDM / ASSESSMENT AND PLAN / ED COURSE  I reviewed the triage vital signs and the nursing notes.   Differential diagnosis includes,  but is not limited to, COVID, influenza, RSV, viral upper respiratory infection.  42 year old male presents to the ED with complaint of cough and congestion for the last 8 days.  Patient was made aware that his respiratory panel was negative and reassured.  A prescription for Tessalon was sent to the pharmacy for him to take as needed for cough.  He is encouraged to follow-up with his PCP if any continued problems.  We also discussed making sure that he is drinking plenty of fluids to stay hydrated.      Patient's presentation is most consistent with acute complicated illness / injury requiring diagnostic workup.  FINAL CLINICAL IMPRESSION(S) / ED DIAGNOSES   Final diagnoses:  Viral URI with cough     Rx / DC Orders   ED Discharge Orders          Ordered    benzonatate (TESSALON) 200 MG capsule  3 times daily PRN        03/16/23 1155  Note:  This document was prepared using Dragon voice recognition software and may include unintentional dictation errors.   Tommi Rumps, PA-C 03/16/23 1544    Sharman Cheek, MD 03/17/23 [phone]

## 2023-03-16 NOTE — ED Triage Notes (Signed)
Pt sts that he thinks that he has a URI that has been going on for the last seven days.

## 2023-05-23 ENCOUNTER — Other Ambulatory Visit: Payer: Self-pay

## 2023-05-23 ENCOUNTER — Emergency Department
Admission: EM | Admit: 2023-05-23 | Discharge: 2023-05-23 | Disposition: A | Discharge status: Home / Self Care | Payer: BLUE CROSS/BLUE SHIELD | Attending: Emergency Medicine | Admitting: Emergency Medicine

## 2023-05-23 DIAGNOSIS — E119 Type 2 diabetes mellitus without complications: Secondary | ICD-10-CM | POA: Insufficient documentation

## 2023-05-23 DIAGNOSIS — I10 Essential (primary) hypertension: Secondary | ICD-10-CM | POA: Diagnosis not present

## 2023-05-23 DIAGNOSIS — L0501 Pilonidal cyst with abscess: Secondary | ICD-10-CM | POA: Diagnosis not present

## 2023-05-23 DIAGNOSIS — K6289 Other specified diseases of anus and rectum: Secondary | ICD-10-CM | POA: Diagnosis present

## 2023-05-23 MED ORDER — DOXYCYCLINE HYCLATE 100 MG PO TABS: 100.0000 mg | ORAL_TABLET | Freq: Two times a day (BID) | ORAL | 0 refills | Status: AC

## 2023-05-23 MED ORDER — BUPIVACAINE HCL (PF) 0.5 % IJ SOLN
10.0000 mL | Freq: Once | INTRAMUSCULAR | Status: AC
  Administered 2023-05-23: 10 mL
  Filled 2023-05-23: qty 10

## 2023-05-23 MED ORDER — LIDOCAINE HCL (PF) 1 % IJ SOLN
5.0000 mL | Freq: Once | INTRAMUSCULAR | Status: AC
  Administered 2023-05-23: 5 mL
  Filled 2023-05-23: qty 5

## 2023-05-23 MED ORDER — OXYCODONE-ACETAMINOPHEN 5-325 MG PO TABS
1.0000 | ORAL_TABLET | Freq: Once | ORAL | Status: AC
  Administered 2023-05-23: 1 via ORAL
  Filled 2023-05-23: qty 1

## 2023-05-23 MED ORDER — SULFAMETHOXAZOLE-TRIMETHOPRIM 800-160 MG PO TABS
1.0000 | ORAL_TABLET | Freq: Once | ORAL | Status: AC
  Administered 2023-05-23: 1 via ORAL
  Filled 2023-05-23: qty 1

## 2023-05-23 MED ORDER — OXYCODONE-ACETAMINOPHEN 5-325 MG PO TABS: 1.0000 | ORAL_TABLET | Freq: Three times a day (TID) | ORAL | 0 refills | Status: AC | PRN

## 2023-05-23 NOTE — ED Triage Notes (Signed)
Patient states "my hemorrhoids have fallen out". Unsure if hemorrhoids or abscess. Patient reports tenderness and pain, denies discharge.

## 2023-05-23 NOTE — ED Provider Notes (Signed)
[facility] Emergency Department Provider Note     Event Date/Time   First MD Initiated Contact with Patient 05/23/23 1947     (approximate)   History   Hemorrhoids   HPI  Dalton Foster is a 42 y.o. male with a history of HTN, DM, and tobacco use presents to the ED for evaluation of rectal pain. He believes his "hemorrhoids have fallen out!" He endorsed pain but denies rectal bleeding.  Patient does not have a history of hemorrhoids, rectal prolapse, anal fissures, or chronic constipation.  He reports pain that seem to be worse with prolonged standing and walking.  No difficulty passing stools and no rectal bleeding is reported.  Physical Exam   Triage Vital Signs: ED Triage Vitals  Encounter Vitals Group     BP 05/23/23 1733 (!) 154/95     Systolic BP Percentile --      Diastolic BP Percentile --      Pulse Rate 05/23/23 1733 (!) 103     Resp 05/23/23 1733 20     Temp 05/23/23 1733 97.8 F (36.6 C)     Temp Source 05/23/23 1733 Oral     SpO2 05/23/23 1733 100 %     Weight 05/23/23 1734 265 lb (120.2 kg)     Height 05/23/23 1734 5\' 7"  (1.702 m)     Head Circumference --      Peak Flow --      Pain Score 05/23/23 1734 10     Pain Loc --      Pain Education --      Exclude from Growth Chart --     Most recent vital signs: Vitals:   05/23/23 2241 05/23/23 2245  BP:  (!) 135/103  Pulse: 80 (!) 110  Resp:  15  Temp: 98 F (36.7 C) 98.1 F (36.7 C)  SpO2:  96%    General Awake, no distress. NAD CV:  Good peripheral perfusion.  RESP:  Normal effort.  ABD:  No distention.  GU:  Rectal exam reveals an early pointing area of fluctuance to the right side of the patient's gluteal cleft.  He does also appear to have a superior gluteal cleft dimple consistent with pilonidal cyst history.    ED Results / Procedures / Treatments   Labs (all labs ordered are listed, but only abnormal results are displayed) Labs Reviewed - No data to  display   EKG   RADIOLOGY   No results found.   PROCEDURES:  Critical Care performed: No  .Incision and Drainage  Date/Time: 05/23/2023 8:30 PM  Performed by: Lissa Hoard, PA-C Authorized by: Lissa Hoard, PA-C   Consent:    Consent obtained:  Verbal   Consent given by:  Patient   Risks, benefits, and alternatives were discussed: yes     Risks discussed:  Bleeding, infection and incomplete drainage   Alternatives discussed:  Delayed treatment Universal protocol:    Site/side marked: yes     Patient identity confirmed:  Verbally with patient Location:    Type:  Pilonidal cyst   Size:  2   Location:  Anogenital   Anogenital location:  Gluteal cleft Pre-procedure details:    Skin preparation:  Povidone-iodine Sedation:    Sedation type:  None Anesthesia:    Anesthesia method:  Local infiltration   Local anesthetic:  Lidocaine 1% w/o epi and bupivacaine 0.5% w/o epi Procedure type:    Complexity:  Simple Procedure details:  Ultrasound guidance: no     Needle aspiration: no     Incision types:  Single straight   Incision depth:  Subcutaneous   Wound management:  Probed and deloculated and irrigated with saline   Drainage:  Purulent   Drainage amount:  Moderate   Wound treatment:  Wound left open   Packing materials:  1/4 in iodoform gauze   Amount 1/4" iodoform:  4 Post-procedure details:    Procedure completion:  Tolerated well, no immediate complications    MEDICATIONS ORDERED IN ED: Medications  lidocaine (PF) (XYLOCAINE) 1 % injection 5 mL (5 mLs Infiltration Given by Other 05/23/23 2109)  bupivacaine(PF) (MARCAINE) 0.5 % injection 10 mL (10 mLs Infiltration Given by Other 05/23/23 2109)  sulfamethoxazole-trimethoprim (BACTRIM DS) 800-160 MG per tablet 1 tablet (1 tablet Oral Given 05/23/23 2110)  oxyCODONE-acetaminophen (PERCOCET/ROXICET) 5-325 MG per tablet 1 tablet (1 tablet Oral Given 05/23/23 2110)     IMPRESSION / MDM  / ASSESSMENT AND PLAN / ED COURSE  I reviewed the triage vital signs and the nursing notes.                              Differential diagnosis includes, but is not limited to, bilateral cyst abscess, gluteal abscess, Marina Goodell rectal abscess, Fournier's gangrene  Patient's presentation is most consistent with acute, uncomplicated illness.  Patient's diagnosis is consistent with pilonidal cyst with abscess.  Patient consented to local I&D procedure which was performed with good expression of a moderate amount of purulent drainage from the area of concern.  The wound was packed by iodoform dressing, the patient was given initial dose of pain medicine as well as Bactrim.  Patient will be discharged home with prescriptions for Percocet and doxycycline. Patient is to follow up with his primary provider as discussed, as needed or otherwise directed.  He was advised he can return to this ED in 3 days for wound check and packing removal.  Patient is given ED precautions to return to the ED for any worsening or new symptoms.   FINAL CLINICAL IMPRESSION(S) / ED DIAGNOSES   Final diagnoses:  Pilonidal cyst with abscess     Rx / DC Orders   ED Discharge Orders          Ordered    oxyCODONE-acetaminophen (PERCOCET) 5-325 MG tablet  Every 8 hours PRN        05/23/23 2227    doxycycline (VIBRA-TABS) 100 MG tablet  2 times daily        05/23/23 2227             Note:  This document was prepared using Dragon voice recognition software and may include unintentional dictation errors.    Lissa Hoard, PA-C 05/23/23 2332    Merwyn Katos, MD 05/23/23 [phone]

## 2023-05-23 NOTE — Discharge Instructions (Signed)
Keep the wound clean, dry, and covered.  Take the antibiotic twice daily as directed.  The pain medicine as needed.  Return to the ED in 3 days for wound check and packing removal.  You to monitor your blood sugars and manage your glucose levels as appropriate.

## 2023-05-26 ENCOUNTER — Other Ambulatory Visit: Payer: Self-pay

## 2023-05-26 ENCOUNTER — Encounter: Payer: Self-pay | Admitting: Emergency Medicine

## 2023-05-26 ENCOUNTER — Emergency Department
Admission: EM | Admit: 2023-05-26 | Discharge: 2023-05-26 | Disposition: A | Discharge status: Home / Self Care | Payer: BLUE CROSS/BLUE SHIELD | Attending: Emergency Medicine | Admitting: Emergency Medicine

## 2023-05-26 DIAGNOSIS — Z4800 Encounter for change or removal of nonsurgical wound dressing: Secondary | ICD-10-CM | POA: Insufficient documentation

## 2023-05-26 DIAGNOSIS — E119 Type 2 diabetes mellitus without complications: Secondary | ICD-10-CM | POA: Insufficient documentation

## 2023-05-26 DIAGNOSIS — I10 Essential (primary) hypertension: Secondary | ICD-10-CM | POA: Insufficient documentation

## 2023-05-26 DIAGNOSIS — Z09 Encounter for follow-up examination after completed treatment for conditions other than malignant neoplasm: Secondary | ICD-10-CM

## 2023-05-26 NOTE — Discharge Instructions (Signed)
Please wash the wound with soap and water daily, then cover with a bandage. You may need to continue to wear a pad in your underwear if it continues to drain. If you have a reoccurrence of the abscess please schedule a follow up appointment with the general surgeon who's information is attached.

## 2023-05-26 NOTE — ED Triage Notes (Signed)
Patient to ED via Pov fro wound check. PT seen on 12/19 and told to come to ED for packing removal.

## 2023-05-26 NOTE — ED Provider Notes (Signed)
[facility] Provider Note    Event Date/Time   First MD Initiated Contact with Patient 05/26/23 1310     (approximate)   History   Wound Check   HPI  Dalton Foster is a 42 y.o. male  with PMH of HTN and diabetes presents for a wound check and packing removal. Patient was seen by this ED on 12/19 for pilonidal abscess which was I&D. He was advised to return for packing removal in 3 days. Patient has no other complaints.       Physical Exam   Triage Vital Signs: ED Triage Vitals  Encounter Vitals Group     BP 05/26/23 1300 (!) 126/98     Systolic BP Percentile --      Diastolic BP Percentile --      Pulse Rate 05/26/23 1300 97     Resp 05/26/23 1300 18     Temp 05/26/23 1300 97.7 F (36.5 C)     Temp Source 05/26/23 1300 Oral     SpO2 05/26/23 1300 95 %     Weight 05/26/23 1259 264 lb 8.8 oz (120 kg)     Height 05/26/23 1259 5\' 7"  (1.702 m)     Head Circumference --      Peak Flow --      Pain Score 05/26/23 1259 0     Pain Loc --      Pain Education --      Exclude from Growth Chart --     Most recent vital signs: Vitals:   05/26/23 1300  BP: (!) 126/98  Pulse: 97  Resp: 18  Temp: 97.7 F (36.5 C)  SpO2: 95%    General: Awake, no distress.  CV:  Good peripheral perfusion.  Resp:  Normal effort.  Abd:  No distention.  Other:  Small open incision in the gluteal cleft with small amount of bloody and purulent drainage.   ED Results / Procedures / Treatments   Labs (all labs ordered are listed, but only abnormal results are displayed) Labs Reviewed - No data to display   PROCEDURES:  Critical Care performed: No  Procedures   MEDICATIONS ORDERED IN ED: Medications - No data to display   IMPRESSION / MDM / ASSESSMENT AND PLAN / ED COURSE  I reviewed the triage vital signs and the nursing notes.                             42 year old male presents for packing removal after a pilonidal cyst I&D 3 days ago. VSS and  patient NAD on exam.  Differential diagnosis includes, but is not limited to, wound check and packing removal, wound infection.  Patient's presentation is most consistent with acute, uncomplicated illness.  Wound packing was easily removed. I irrigated the wound with 10 mL of normal saline and redressed. Patient was instructed on wound care. He voiced understanding, all questions were answered and he was stable at discharge.    FINAL CLINICAL IMPRESSION(S) / ED DIAGNOSES   Final diagnoses:  Encounter for recheck of abscess following incision and drainage     Rx / DC Orders   ED Discharge Orders     None        Note:  This document was prepared using Dragon voice recognition software and may include unintentional dictation errors.   Cameron Ali, PA-C 05/26/23 1330    Minna Antis, MD 05/26/23 1450

## 2023-08-06 ENCOUNTER — Emergency Department: Payer: Self-pay

## 2023-08-06 ENCOUNTER — Other Ambulatory Visit: Payer: Self-pay

## 2023-08-06 DIAGNOSIS — J101 Influenza due to other identified influenza virus with other respiratory manifestations: Secondary | ICD-10-CM | POA: Insufficient documentation

## 2023-08-06 DIAGNOSIS — I10 Essential (primary) hypertension: Secondary | ICD-10-CM | POA: Diagnosis not present

## 2023-08-06 DIAGNOSIS — R059 Cough, unspecified: Secondary | ICD-10-CM | POA: Diagnosis present

## 2023-08-06 LAB — COMPREHENSIVE METABOLIC PANEL
ALT: 24 U/L (ref 0–44)
AST: 62 U/L — ABNORMAL HIGH (ref 15–41)
Albumin: 3.6 g/dL (ref 3.5–5.0)
Alkaline Phosphatase: 62 U/L (ref 38–126)
Anion gap: 9 (ref 5–15)
BUN: 14 mg/dL (ref 6–20)
CO2: 23 mmol/L (ref 22–32)
Calcium: 8.2 mg/dL — ABNORMAL LOW (ref 8.9–10.3)
Chloride: 101 mmol/L (ref 98–111)
Creatinine, Ser: 1.52 mg/dL — ABNORMAL HIGH (ref 0.61–1.24)
GFR, Estimated: 59 mL/min — ABNORMAL LOW (ref >60)
Glucose, Bld: 249 mg/dL — ABNORMAL HIGH (ref 70–99)
Potassium: 3.3 mmol/L — ABNORMAL LOW (ref 3.5–5.1)
Sodium: 133 mmol/L — ABNORMAL LOW (ref 135–145)
Total Bilirubin: 1.3 mg/dL — ABNORMAL HIGH (ref 0.0–1.2)
Total Protein: 7.2 g/dL (ref 6.5–8.1)

## 2023-08-06 LAB — RESP PANEL BY RT-PCR (RSV, FLU A&B, COVID)  RVPGX2
Influenza A by PCR: POSITIVE — AB
Influenza B by PCR: NEGATIVE
Resp Syncytial Virus by PCR: NEGATIVE
SARS Coronavirus 2 by RT PCR: NEGATIVE

## 2023-08-06 LAB — LIPASE, BLOOD: Lipase: 26 U/L (ref 11–51)

## 2023-08-06 LAB — CBC WITH DIFFERENTIAL/PLATELET
Abs Immature Granulocytes: 0.02 10*3/uL (ref 0.00–0.07)
Basophils Absolute: 0 10*3/uL (ref 0.0–0.1)
Basophils Relative: 0 %
Eosinophils Absolute: 0.2 10*3/uL (ref 0.0–0.5)
Eosinophils Relative: 3 %
HCT: 43.6 % (ref 39.0–52.0)
Hemoglobin: 14.3 g/dL (ref 13.0–17.0)
Immature Granulocytes: 0 %
Lymphocytes Relative: 18 %
Lymphs Abs: 1.1 10*3/uL (ref 0.7–4.0)
MCH: 27 pg (ref 26.0–34.0)
MCHC: 32.8 g/dL (ref 30.0–36.0)
MCV: 82.4 fL (ref 80.0–100.0)
Monocytes Absolute: 0.6 10*3/uL (ref 0.1–1.0)
Monocytes Relative: 9 %
Neutro Abs: 4.2 10*3/uL (ref 1.7–7.7)
Neutrophils Relative %: 70 %
Platelets: 120 10*3/uL — ABNORMAL LOW (ref 150–400)
RBC: 5.29 MIL/uL (ref 4.22–5.81)
RDW: 12.9 % (ref 11.5–15.5)
WBC: 6.1 10*3/uL (ref 4.0–10.5)
nRBC: 0 % (ref 0.0–0.2)

## 2023-08-06 NOTE — ED Triage Notes (Signed)
 Pt arrives via POV with CC of cough and abd pain that has been on going for three days.Denies nausea. Reports diarrhea. NAD at this time.

## 2023-08-07 ENCOUNTER — Emergency Department
Admission: EM | Admit: 2023-08-07 | Discharge: 2023-08-07 | Disposition: A | Discharge status: Home / Self Care | Payer: Self-pay | Attending: Emergency Medicine | Admitting: Emergency Medicine

## 2023-08-07 DIAGNOSIS — J101 Influenza due to other identified influenza virus with other respiratory manifestations: Secondary | ICD-10-CM

## 2023-08-07 LAB — URINALYSIS, ROUTINE W REFLEX MICROSCOPIC
Bilirubin Urine: NEGATIVE
Glucose, UA: 50 mg/dL — AB
Ketones, ur: NEGATIVE mg/dL
Leukocytes,Ua: NEGATIVE
Nitrite: NEGATIVE
Protein, ur: >=300 mg/dL — AB
Specific Gravity, Urine: 1.032 — ABNORMAL HIGH (ref 1.005–1.030)
pH: 5 (ref 5.0–8.0)

## 2023-08-07 MED ORDER — BENZONATATE 100 MG PO CAPS
200.0000 mg | ORAL_CAPSULE | Freq: Once | ORAL | Status: AC
  Administered 2023-08-07: 200 mg via ORAL
  Filled 2023-08-07: qty 2

## 2023-08-07 MED ORDER — BENZONATATE 100 MG PO CAPS: 100.0000 mg | ORAL_CAPSULE | Freq: Three times a day (TID) | ORAL | 0 refills | Status: DC | PRN

## 2023-08-07 MED ORDER — ACETAMINOPHEN 500 MG PO TABS
1000.0000 mg | ORAL_TABLET | Freq: Once | ORAL | Status: AC
  Administered 2023-08-07: 1000 mg via ORAL
  Filled 2023-08-07: qty 2

## 2023-08-07 MED ORDER — ACETAMINOPHEN 325 MG PO TABS: 975.0000 mg | ORAL_TABLET | Freq: Three times a day (TID) | ORAL | 0 refills | Status: AC | PRN

## 2023-08-07 NOTE — ED Provider Notes (Signed)
 [facility] Provider Note    Event Date/Time   First MD Initiated Contact with Patient 08/07/23 0136     (approximate)   History   Chief Complaint: Cough and Abdominal Pain   HPI  Dalton Foster is a 43 y.o. male with a history of hypertension who comes ED complaining of headache, nonproductive cough, body aches, fatigue, chills for the past 3 days.  Tolerating normal oral intake.  No vomiting.          Physical Exam   Triage Vital Signs: ED Triage Vitals  Encounter Vitals Group     BP 08/06/23 2248 (!) 137/98     Systolic BP Percentile --      Diastolic BP Percentile --      Pulse Rate 08/06/23 2248 (!) 108     Resp 08/06/23 2242 (!) 22     Temp 08/06/23 2248 98.4 F (36.9 C)     Temp Source 08/06/23 2248 Oral     SpO2 08/06/23 2248 100 %     Weight 08/06/23 2244 250 lb (113.4 kg)     Height 08/06/23 2244 5\' 7"  (1.702 m)     Head Circumference --      Peak Flow --      Pain Score 08/06/23 2242 6     Pain Loc --      Pain Education --      Exclude from Growth Chart --     Most recent vital signs: Vitals:   08/07/23 0141 08/07/23 0237  BP: (!) 131/92 126/84  Pulse: 100 89  Resp: 20 18  Temp:  98 F (36.7 C)  SpO2: 100% 99%    General: Awake, no distress.  CV:  Good peripheral perfusion.  Regular rate rhythm Resp:  Normal effort.  Clear to auscultation bilaterally Abd:  No distention.  Soft nontender Other:  Moist oral mucosa   ED Results / Procedures / Treatments   Labs (all labs ordered are listed, but only abnormal results are displayed) Labs Reviewed  RESP PANEL BY RT-PCR (RSV, FLU A&B, COVID)  RVPGX2 - Abnormal; Notable for the following components:      Result Value   Influenza A by PCR POSITIVE (*)    All other components within normal limits  CBC WITH DIFFERENTIAL/PLATELET - Abnormal; Notable for the following components:   Platelets 120 (*)    All other components within normal limits  COMPREHENSIVE  METABOLIC PANEL - Abnormal; Notable for the following components:   Sodium 133 (*)    Potassium 3.3 (*)    Glucose, Bld 249 (*)    Creatinine, Ser 1.52 (*)    Calcium 8.2 (*)    AST 62 (*)    Total Bilirubin 1.3 (*)    GFR, Estimated 59 (*)    All other components within normal limits  URINALYSIS, ROUTINE W REFLEX MICROSCOPIC - Abnormal; Notable for the following components:   Color, Urine AMBER (*)    APPearance HAZY (*)    Specific Gravity, Urine 1.032 (*)    Glucose, UA 50 (*)    Hgb urine dipstick MODERATE (*)    Protein, ur >=300 (*)    Bacteria, UA RARE (*)    All other components within normal limits  LIPASE, BLOOD     EKG    RADIOLOGY Chest x-ray interpreted by me, appears normal.  Radiology report reviewed   PROCEDURES:  Procedures   MEDICATIONS ORDERED IN ED: Medications  acetaminophen (TYLENOL) tablet  1,000 mg (1,000 mg Oral Given 08/07/23 0220)  benzonatate (TESSALON) capsule 200 mg (200 mg Oral Given 08/07/23 0220)     IMPRESSION / MDM / ASSESSMENT AND PLAN / ED COURSE  I reviewed the triage vital signs and the nursing notes.  DDx: COVID, influenza, AKI, dehydration, electrolyte abnormality, anemia  Patient's presentation is most consistent with acute presentation with potential threat to life or bodily function.  Patient presents with constitutional symptoms consistent with an influenza-like illness.  Flu test is positive.  Vital signs are reassuring, labs reassuring, chest x-ray unremarkable, patient given Tessalon and Tylenol.  Better, stable for discharge.       FINAL CLINICAL IMPRESSION(S) / ED DIAGNOSES   Final diagnoses:  Influenza A     Rx / DC Orders   ED Discharge Orders          Ordered    benzonatate (TESSALON PERLES) 100 MG capsule  3 times daily PRN        08/07/23 0211    acetaminophen (TYLENOL) 325 MG tablet  Every 8 hours PRN        08/07/23 0211             Note:  This document was prepared using Dragon voice  recognition software and may include unintentional dictation errors.   Sharman Cheek, MD 08/07/23 [phone]

## 2024-02-24 ENCOUNTER — Emergency Department: Payer: MEDICAID

## 2024-02-24 ENCOUNTER — Emergency Department
Admission: EM | Admit: 2024-02-24 | Discharge: 2024-02-24 | Disposition: A | Discharge status: Home / Self Care | Payer: MEDICAID | Attending: Emergency Medicine | Admitting: Emergency Medicine

## 2024-02-24 ENCOUNTER — Other Ambulatory Visit: Payer: Self-pay

## 2024-02-24 ENCOUNTER — Encounter: Payer: Self-pay | Admitting: Emergency Medicine

## 2024-02-24 DIAGNOSIS — I10 Essential (primary) hypertension: Secondary | ICD-10-CM | POA: Diagnosis not present

## 2024-02-24 DIAGNOSIS — J069 Acute upper respiratory infection, unspecified: Secondary | ICD-10-CM | POA: Diagnosis not present

## 2024-02-24 DIAGNOSIS — H1031 Unspecified acute conjunctivitis, right eye: Secondary | ICD-10-CM | POA: Insufficient documentation

## 2024-02-24 DIAGNOSIS — E119 Type 2 diabetes mellitus without complications: Secondary | ICD-10-CM | POA: Diagnosis not present

## 2024-02-24 DIAGNOSIS — R059 Cough, unspecified: Secondary | ICD-10-CM | POA: Diagnosis present

## 2024-02-24 LAB — RESP PANEL BY RT-PCR (RSV, FLU A&B, COVID)  RVPGX2
Influenza A by PCR: NEGATIVE
Influenza B by PCR: NEGATIVE
Resp Syncytial Virus by PCR: NEGATIVE
SARS Coronavirus 2 by RT PCR: NEGATIVE

## 2024-02-24 MED ORDER — GUAIFENESIN-CODEINE 100-10 MG/5ML PO SOLN: 10.0000 mL | Freq: Three times a day (TID) | ORAL | 0 refills | Status: AC | PRN

## 2024-02-24 MED ORDER — BENZONATATE 100 MG PO CAPS
100.0000 mg | ORAL_CAPSULE | Freq: Three times a day (TID) | ORAL | 0 refills | Status: AC | PRN
Start: 2024-02-24 — End: 2025-02-23

## 2024-02-24 MED ORDER — POLYMYXIN B-TRIMETHOPRIM 10000-0.1 UNIT/ML-% OP SOLN: 1.0000 [drp] | Freq: Four times a day (QID) | OPHTHALMIC | 0 refills | Status: AC

## 2024-02-24 NOTE — Discharge Instructions (Signed)
 You tested negative for flu, covid and RSV today.  Your chest x-ray was negative today.  Your right eye does appear infected so I have sent some antibiotic eyedrops for you to use.  Please follow-up with your primary care provider.  You may continue taking cold medicine as needed.  I have sent some cough medicine for you to take.  The medicine containing codeine  is to only be taken at night as you cannot drive after taking it.

## 2024-02-24 NOTE — ED Provider Notes (Signed)
 [facility] 2 Provider Note    Event Date/Time   First MD Initiated Contact with Patient 02/24/24 2051     (approximate)   History   Nasal Congestion   HPI  Dalton Foster is a 43 y.o. male with PMH of hypertension, diabetes who presents for evaluation of nasal congestion for 2 days.  Patient also endorses some cough, congestion, chills and subjective fever.  Denies chest pain and shortness of breath.  Reports he has taken some cold medicine.  States he came to the emergency department because his mom wanted him to be evaluated.      Physical Exam   Triage Vital Signs: ED Triage Vitals  Encounter Vitals Group     BP 02/24/24 1905 (!) 159/101     Girls Systolic BP Percentile --      Girls Diastolic BP Percentile --      Boys Systolic BP Percentile --      Boys Diastolic BP Percentile --      Pulse Rate 02/24/24 1905 (!) 103     Resp 02/24/24 1905 20     Temp 02/24/24 1905 99.5 F (37.5 C)     Temp Source 02/24/24 1905 Oral     SpO2 02/24/24 1905 100 %     Weight 02/24/24 1907 251 lb 12.3 oz (114.2 kg)     Height 02/24/24 1907 5' 7 (1.702 m)     Head Circumference --      Peak Flow --      Pain Score 02/24/24 1908 0     Pain Loc --      Pain Education --      Exclude from Growth Chart --     Most recent vital signs: Vitals:   02/24/24 1905  BP: (!) 159/101  Pulse: (!) 103  Resp: 20  Temp: 99.5 F (37.5 C)  SpO2: 100%   General: Awake, no distress.  CV:  Good peripheral perfusion.  RRR. Resp:  Normal effort.  CTAB. Abd:  No distention.  Other:  Purulent drainage noted from the right eye with some conjunctival injection   ED Results / Procedures / Treatments   Labs (all labs ordered are listed, but only abnormal results are displayed) Labs Reviewed  RESP PANEL BY RT-PCR (RSV, FLU A&B, COVID)  [id number]     PROCEDURES:  Critical Care performed: No  Procedures   MEDICATIONS ORDERED IN ED: Medications - No data to  display   IMPRESSION / MDM / ASSESSMENT AND PLAN / ED COURSE  I reviewed the triage vital signs and the nursing notes.                             43 year old male presents for evaluation of URI symptoms.  Blood pressure and heart rate were elevated upon initial assessment.  Vital signs stable otherwise.  Patient NAD on exam.  Differential diagnosis includes, but is not limited to, flu, COVID, RSV, conjunctivitis, pneumonia.  Patient's presentation is most consistent with acute complicated illness / injury requiring diagnostic workup.  Respiratory panel is negative.  Chest x-ray is also negative.  Suspect that patient has a viral URI.  Recommended symptomatic management using over-the-counter cold medicine.  I will also send cough medicine for him to take.  Patient had some purulent drainage from the right eye consistent with conjunctivitis.  Patient reports this has been ongoing for a few days.  It is greenish in  color.  Will start him on antibiotics for bacterial conjunctivitis.  Advised patient to follow-up with his primary care provider and return to the emergency department with any worsening symptoms.  He voiced understanding, all questions were answered and he is stable at discharge.      FINAL CLINICAL IMPRESSION(S) / ED DIAGNOSES   Final diagnoses:  Upper respiratory tract infection, unspecified type  Acute conjunctivitis of right eye, unspecified acute conjunctivitis type     Rx / DC Orders   ED Discharge Orders          Ordered    trimethoprim -polymyxin b  (POLYTRIM ) ophthalmic solution  Every 6 hours        02/24/24 2241    guaiFENesin -codeine  100-10 MG/5ML syrup  3 times daily PRN        02/24/24 2241    benzonatate  (TESSALON  PERLES) 100 MG capsule  3 times daily PRN        02/24/24 2241             Note:  This document was prepared using Dragon voice recognition software and may include unintentional dictation errors.   Cleaster Tinnie LABOR,  PA-C 02/24/24 2246    Floy Roberts, MD 02/24/24 [phone]

## 2024-02-24 NOTE — ED Triage Notes (Signed)
 Pt presents to the ED via POV with complaints of cold like sx x 2 days. Has tried OTC cough meds without minimal improvement. He endorses cough, chills, subjective fever. A&Ox4 at this time. Denies CP or SOB.
